# Patient Record
Sex: Female | Born: 1965
Health system: Southern US, Community
[De-identification: ages and names within clinical notes are randomized; demographics above are authoritative.]

## PROBLEM LIST (undated history)

## (undated) DIAGNOSIS — F419 Anxiety disorder, unspecified: Secondary | ICD-10-CM

## (undated) DIAGNOSIS — G4700 Insomnia, unspecified: Secondary | ICD-10-CM

## (undated) DIAGNOSIS — F32A Depression, unspecified: Secondary | ICD-10-CM

## (undated) DIAGNOSIS — J189 Pneumonia, unspecified organism: Secondary | ICD-10-CM

## (undated) DIAGNOSIS — F329 Major depressive disorder, single episode, unspecified: Secondary | ICD-10-CM

## (undated) DIAGNOSIS — M549 Dorsalgia, unspecified: Secondary | ICD-10-CM

## (undated) DIAGNOSIS — F4310 Post-traumatic stress disorder, unspecified: Secondary | ICD-10-CM

## (undated) DIAGNOSIS — T4145XA Adverse effect of unspecified anesthetic, initial encounter: Secondary | ICD-10-CM

## (undated) DIAGNOSIS — Z8669 Personal history of other diseases of the nervous system and sense organs: Secondary | ICD-10-CM

## (undated) DIAGNOSIS — G8929 Other chronic pain: Secondary | ICD-10-CM

## (undated) DIAGNOSIS — T8859XA Other complications of anesthesia, initial encounter: Secondary | ICD-10-CM

## (undated) DIAGNOSIS — Z8709 Personal history of other diseases of the respiratory system: Secondary | ICD-10-CM

## (undated) HISTORY — PX: OTHER SURGICAL HISTORY: SHX169

## (undated) HISTORY — PX: BACK SURGERY: SHX140

---

## 2003-02-02 DIAGNOSIS — Z8709 Personal history of other diseases of the respiratory system: Secondary | ICD-10-CM

## 2003-02-02 HISTORY — DX: Personal history of other diseases of the respiratory system: Z87.09

## 2014-06-23 ENCOUNTER — Emergency Department (HOSPITAL_COMMUNITY)
Admission: EM | Admit: 2014-06-23 | Discharge: 2014-06-23 | Disposition: A | Discharge status: Home / Self Care | Payer: Medicare Other | Attending: Emergency Medicine | Admitting: Emergency Medicine

## 2014-06-23 ENCOUNTER — Emergency Department (HOSPITAL_COMMUNITY): Payer: Medicare Other

## 2014-06-23 ENCOUNTER — Encounter (HOSPITAL_COMMUNITY): Payer: Self-pay | Admitting: Emergency Medicine

## 2014-06-23 DIAGNOSIS — F419 Anxiety disorder, unspecified: Secondary | ICD-10-CM | POA: Diagnosis not present

## 2014-06-23 DIAGNOSIS — Y998 Other external cause status: Secondary | ICD-10-CM | POA: Diagnosis not present

## 2014-06-23 DIAGNOSIS — Y9389 Activity, other specified: Secondary | ICD-10-CM | POA: Insufficient documentation

## 2014-06-23 DIAGNOSIS — Z79899 Other long term (current) drug therapy: Secondary | ICD-10-CM | POA: Diagnosis not present

## 2014-06-23 DIAGNOSIS — Y9289 Other specified places as the place of occurrence of the external cause: Secondary | ICD-10-CM | POA: Insufficient documentation

## 2014-06-23 DIAGNOSIS — S99922A Unspecified injury of left foot, initial encounter: Secondary | ICD-10-CM | POA: Diagnosis present

## 2014-06-23 DIAGNOSIS — Z87891 Personal history of nicotine dependence: Secondary | ICD-10-CM | POA: Insufficient documentation

## 2014-06-23 DIAGNOSIS — W208XXA Other cause of strike by thrown, projected or falling object, initial encounter: Secondary | ICD-10-CM | POA: Insufficient documentation

## 2014-06-23 DIAGNOSIS — S9032XA Contusion of left foot, initial encounter: Secondary | ICD-10-CM

## 2014-06-23 DIAGNOSIS — J398 Other specified diseases of upper respiratory tract: Secondary | ICD-10-CM | POA: Diagnosis not present

## 2014-06-23 HISTORY — DX: Anxiety disorder, unspecified: F41.9

## 2014-06-23 MED ORDER — HYDROCODONE-ACETAMINOPHEN 5-325 MG PO TABS: 1.0000 | ORAL_TABLET | ORAL | Status: DC | PRN

## 2014-06-23 MED ORDER — KETOROLAC TROMETHAMINE 60 MG/2ML IM SOLN
60.0000 mg | Freq: Once | INTRAMUSCULAR | Status: AC
  Administered 2014-06-23: 60 mg via INTRAMUSCULAR
  Filled 2014-06-23: qty 2

## 2014-06-23 NOTE — ED Provider Notes (Signed)
CSN: [id number]     Arrival date & time 06/23/14  1216 History  This chart was scribed for non-physician practitioner Kerrie Buffalo, NP, working with Vanetta Mulders, MD, by Tanda Rockers, ED Scribe. This patient was seen in room APA15/APA15 and the patient's care was started at 1:10 PM.    Chief Complaint  Patient presents with  . Foot Pain    The history is provided by the patient. No language interpreter was used.     HPI Comments: Kayla Howard is a 49 y.o. female who presents to the Emergency Department complaining of left foot pain x 1 month, gradually worsening. She describes it as a throbbing sensation. Pt dropped a 20 lb weight on her left foot on 05/15/2014. Pt was seen at [facility] for this and had DG L Foot with no acute findings. Pt mentions that the swelling has gradually been decreasing but she is unable to bear weight on foot causing her to be unable to walk. Pt has been icing foot and using spinal cord stimulator insertion without relief. She has been taking 800 mg Ibuprofen without relief. Denies weakness, numbness, tingling, color change, or any other symptoms.   Past Medical History  Diagnosis Date  . Anxiety    Past Surgical History  Procedure Laterality Date  . Back surgery    . Spinal cord stimulator insertion     Family History  Problem Relation Age of Onset  . Hypertension Mother   . Diabetes Father    History  Substance Use Topics  . Smoking status: Former Games developer  . Smokeless tobacco: Never Used  . Alcohol Use: No   OB History    Gravida Para Term Preterm AB TAB SAB Ectopic Multiple Living   Review of Systems  Constitutional: Negative for appetite change and fatigue.  HENT: Negative for congestion, ear discharge and sinus pressure.   Eyes: Negative for discharge.  Respiratory: Negative for cough.   Cardiovascular: Negative for chest pain.  Gastrointestinal: Negative for abdominal pain and diarrhea.  Genitourinary: Negative for  frequency and hematuria.  Musculoskeletal: Positive for joint swelling, arthralgias (Left foot pain.) and gait problem. Negative for back pain.  Skin: Negative for color change and rash.  Neurological: Negative for seizures, weakness, numbness and headaches.  Psychiatric/Behavioral: Negative for hallucinations.      Allergies  Ambien; Flexeril; and Morphine and related  Home Medications   Prior to Admission medications   Medication Sig Start Date End Date Taking? Authorizing Provider  diazepam (VALIUM) 10 MG tablet Take 1 tablet by mouth daily as needed for anxiety.  06/04/14  Yes Historical Provider, MD  ibuprofen (ADVIL,MOTRIN) 800 MG tablet Take 800 mg by mouth every 8 (eight) hours as needed for moderate pain.   Yes Historical Provider, MD  lamoTRIgine (LAMICTAL) 200 MG tablet Take 2 tablets by mouth at bedtime.  04/03/14  Yes Historical Provider, MD  LATUDA 40 MG TABS tablet Take 0.5 tablets by mouth at bedtime.  04/03/14  Yes Historical Provider, MD  prazosin (MINIPRESS) 1 MG capsule Take 1 capsule by mouth at bedtime.  04/03/14  Yes Historical Provider, MD  traZODone (DESYREL) 100 MG tablet Take 2 tablets by mouth at bedtime.  04/03/14  Yes Historical Provider, MD  HYDROcodone-acetaminophen (NORCO/VICODIN) 5-325 MG per tablet Take 1 tablet by mouth every 4 (four) hours as needed. 06/23/14   Hope Orlene Och, NP   Triage Vitals: BP 167/79 mmHg  Pulse 79  Temp(Src) 97.9 F (36.6 C) (Oral)  Resp 16  Ht 6\' 1"  (1.854 m)  Wt 158 lb (71.668 kg)  BMI 20.85 kg/m2  SpO2 100%  LMP 06/22/2014   Physical Exam  Constitutional: She is oriented to person, place, and time. She appears well-developed and well-nourished. No distress.  HENT:  Head: Normocephalic and atraumatic.  Eyes: Conjunctivae and EOM are normal.  Neck: Neck supple. Tracheal deviation present.  Cardiovascular: Normal rate.   Pulmonary/Chest: Effort normal.  Musculoskeletal: Normal range of motion.       Feet:  Swelling and  ecchymosis at left toes and extends to the ankle. Pedal pulses 2+ bilateral, adequate circulation, good touch sensation. Equal strength with plantar and dorsiflexion.    Neurological: She is alert and oriented to person, place, and time.  Skin: Skin is warm and dry.  Psychiatric: She has a normal mood and affect. Her behavior is normal.  Nursing note and vitals reviewed.   ED Course  Procedures (including critical care time) X-ray, Toradol 60 mg IM ASO, (patient has crutches), ice  DIAGNOSTIC STUDIES: Oxygen Saturation is 100% on RA, normal by my interpretation.    COORDINATION OF CARE: 1:15 PM-Discussed treatment plan which includes awaiting DG L Foot results with pt at bedside and pt agreed to plan.   Labs Review Labs Reviewed - No data to display  Imaging Review Dg Foot Complete Left  06/23/2014   CLINICAL DATA:  Left foot pain/injury  EXAM: LEFT FOOT - COMPLETE 3+ VIEW  COMPARISON:  None.  FINDINGS: No fracture or dislocation is seen.  The joint spaces are preserved.  Visualized soft tissues are within normal limits.  IMPRESSION: No fracture or dislocation is seen.   Electronically Signed   By: Charline BillsSriyesh  Krishnan M.D.   On: 06/23/2014 13:06    MDM  49 y.o. female with pain, swelling and ecchymosis to the left foot that extend to the ankle s/p injury. Placed in ASO, pain management, stable for discharge without neurovascular compromise. She will follow up with ortho as needed. Discussed with the patient clinical and x-ray findings and plan of care and all questioned fully answered.   Final diagnoses:  Contusion of left foot, initial encounter   I personally performed the services described in this documentation, which was scribed in my presence. The recorded information has been reviewed and is accurate.      Dix HillsHope M Neese, NP 06/23/14 1423  Vanetta MuldersScott Zackowski, MD 06/25/14 [phone]

## 2014-06-23 NOTE — ED Notes (Signed)
Pt dropped a 20 lb weight on her L foot April 13. [facility] and told there was no fx. Pt states pain is worse and she is unable to bear weight on the foot.

## 2014-06-23 NOTE — Discharge Instructions (Signed)
°  Take the medication as directed. Do not take if driving as it will make you sleepy. Follow up with Dr. Romeo AppleHarrison or return as needed.

## 2015-02-03 DIAGNOSIS — M5030 Other cervical disc degeneration, unspecified cervical region: Secondary | ICD-10-CM | POA: Diagnosis not present

## 2015-02-03 DIAGNOSIS — Z79899 Other long term (current) drug therapy: Secondary | ICD-10-CM | POA: Diagnosis not present

## 2015-02-03 DIAGNOSIS — Z981 Arthrodesis status: Secondary | ICD-10-CM | POA: Diagnosis not present

## 2015-02-03 DIAGNOSIS — M5000 Cervical disc disorder with myelopathy, unspecified cervical region: Secondary | ICD-10-CM | POA: Diagnosis not present

## 2015-02-03 DIAGNOSIS — M4692 Unspecified inflammatory spondylopathy, cervical region: Secondary | ICD-10-CM | POA: Diagnosis not present

## 2015-02-03 DIAGNOSIS — Z87891 Personal history of nicotine dependence: Secondary | ICD-10-CM | POA: Diagnosis not present

## 2015-02-03 DIAGNOSIS — M1990 Unspecified osteoarthritis, unspecified site: Secondary | ICD-10-CM | POA: Diagnosis not present

## 2015-02-14 DIAGNOSIS — D72829 Elevated white blood cell count, unspecified: Secondary | ICD-10-CM | POA: Diagnosis not present

## 2015-03-06 DIAGNOSIS — F411 Generalized anxiety disorder: Secondary | ICD-10-CM | POA: Diagnosis not present

## 2015-03-06 DIAGNOSIS — F320 Major depressive disorder, single episode, mild: Secondary | ICD-10-CM | POA: Diagnosis not present

## 2015-03-06 DIAGNOSIS — F4310 Post-traumatic stress disorder, unspecified: Secondary | ICD-10-CM | POA: Diagnosis not present

## 2015-03-20 DIAGNOSIS — R0781 Pleurodynia: Secondary | ICD-10-CM | POA: Diagnosis not present

## 2015-03-20 DIAGNOSIS — R079 Chest pain, unspecified: Secondary | ICD-10-CM | POA: Diagnosis not present

## 2015-03-20 DIAGNOSIS — Z79899 Other long term (current) drug therapy: Secondary | ICD-10-CM | POA: Diagnosis not present

## 2015-03-20 DIAGNOSIS — Z888 Allergy status to other drugs, medicaments and biological substances status: Secondary | ICD-10-CM | POA: Diagnosis not present

## 2015-05-29 DIAGNOSIS — F320 Major depressive disorder, single episode, mild: Secondary | ICD-10-CM | POA: Diagnosis not present

## 2015-05-29 DIAGNOSIS — F4310 Post-traumatic stress disorder, unspecified: Secondary | ICD-10-CM | POA: Diagnosis not present

## 2015-05-29 DIAGNOSIS — F411 Generalized anxiety disorder: Secondary | ICD-10-CM | POA: Diagnosis not present

## 2015-06-25 DIAGNOSIS — M961 Postlaminectomy syndrome, not elsewhere classified: Secondary | ICD-10-CM | POA: Diagnosis not present

## 2015-06-25 DIAGNOSIS — M5412 Radiculopathy, cervical region: Secondary | ICD-10-CM | POA: Diagnosis not present

## 2015-06-25 DIAGNOSIS — M542 Cervicalgia: Secondary | ICD-10-CM | POA: Diagnosis not present

## 2015-07-07 DIAGNOSIS — M961 Postlaminectomy syndrome, not elsewhere classified: Secondary | ICD-10-CM | POA: Diagnosis not present

## 2015-07-07 DIAGNOSIS — M5412 Radiculopathy, cervical region: Secondary | ICD-10-CM | POA: Diagnosis not present

## 2015-08-08 DIAGNOSIS — M5412 Radiculopathy, cervical region: Secondary | ICD-10-CM | POA: Diagnosis not present

## 2015-08-08 DIAGNOSIS — M542 Cervicalgia: Secondary | ICD-10-CM | POA: Diagnosis not present

## 2015-08-08 DIAGNOSIS — M961 Postlaminectomy syndrome, not elsewhere classified: Secondary | ICD-10-CM | POA: Diagnosis not present

## 2015-08-28 DIAGNOSIS — F321 Major depressive disorder, single episode, moderate: Secondary | ICD-10-CM | POA: Diagnosis not present

## 2015-08-28 DIAGNOSIS — F4310 Post-traumatic stress disorder, unspecified: Secondary | ICD-10-CM | POA: Diagnosis not present

## 2015-08-28 DIAGNOSIS — F411 Generalized anxiety disorder: Secondary | ICD-10-CM | POA: Diagnosis not present

## 2015-09-02 DIAGNOSIS — M542 Cervicalgia: Secondary | ICD-10-CM | POA: Diagnosis not present

## 2015-09-02 DIAGNOSIS — M5412 Radiculopathy, cervical region: Secondary | ICD-10-CM | POA: Diagnosis not present

## 2015-09-22 DIAGNOSIS — F4310 Post-traumatic stress disorder, unspecified: Secondary | ICD-10-CM | POA: Diagnosis not present

## 2015-09-22 DIAGNOSIS — Z1389 Encounter for screening for other disorder: Secondary | ICD-10-CM | POA: Diagnosis not present

## 2015-09-22 DIAGNOSIS — F419 Anxiety disorder, unspecified: Secondary | ICD-10-CM | POA: Diagnosis not present

## 2015-09-22 DIAGNOSIS — Z789 Other specified health status: Secondary | ICD-10-CM | POA: Diagnosis not present

## 2015-10-02 DIAGNOSIS — R51 Headache: Secondary | ICD-10-CM | POA: Diagnosis not present

## 2015-10-02 DIAGNOSIS — M961 Postlaminectomy syndrome, not elsewhere classified: Secondary | ICD-10-CM | POA: Diagnosis not present

## 2015-10-02 DIAGNOSIS — M542 Cervicalgia: Secondary | ICD-10-CM | POA: Diagnosis not present

## 2015-10-08 DIAGNOSIS — Z020 Encounter for examination for admission to educational institution: Secondary | ICD-10-CM | POA: Diagnosis not present

## 2015-10-22 DIAGNOSIS — Z713 Dietary counseling and surveillance: Secondary | ICD-10-CM | POA: Diagnosis not present

## 2015-10-22 DIAGNOSIS — Z6821 Body mass index (BMI) 21.0-21.9, adult: Secondary | ICD-10-CM | POA: Diagnosis not present

## 2015-10-22 DIAGNOSIS — Z020 Encounter for examination for admission to educational institution: Secondary | ICD-10-CM | POA: Diagnosis not present

## 2015-10-22 DIAGNOSIS — Z299 Encounter for prophylactic measures, unspecified: Secondary | ICD-10-CM | POA: Diagnosis not present

## 2015-10-22 DIAGNOSIS — F39 Unspecified mood [affective] disorder: Secondary | ICD-10-CM | POA: Diagnosis not present

## 2015-11-20 DIAGNOSIS — F411 Generalized anxiety disorder: Secondary | ICD-10-CM | POA: Diagnosis not present

## 2015-11-20 DIAGNOSIS — F321 Major depressive disorder, single episode, moderate: Secondary | ICD-10-CM | POA: Diagnosis not present

## 2015-11-20 DIAGNOSIS — F4310 Post-traumatic stress disorder, unspecified: Secondary | ICD-10-CM | POA: Diagnosis not present

## 2015-11-28 DIAGNOSIS — M545 Low back pain: Secondary | ICD-10-CM | POA: Diagnosis not present

## 2015-11-28 DIAGNOSIS — M5412 Radiculopathy, cervical region: Secondary | ICD-10-CM | POA: Diagnosis not present

## 2015-11-28 DIAGNOSIS — R51 Headache: Secondary | ICD-10-CM | POA: Diagnosis not present

## 2015-11-28 DIAGNOSIS — M961 Postlaminectomy syndrome, not elsewhere classified: Secondary | ICD-10-CM | POA: Diagnosis not present

## 2015-11-28 DIAGNOSIS — M542 Cervicalgia: Secondary | ICD-10-CM | POA: Diagnosis not present

## 2015-12-05 DIAGNOSIS — Z23 Encounter for immunization: Secondary | ICD-10-CM | POA: Diagnosis not present

## 2016-01-03 DIAGNOSIS — G8929 Other chronic pain: Secondary | ICD-10-CM | POA: Diagnosis not present

## 2016-01-03 DIAGNOSIS — M545 Low back pain: Secondary | ICD-10-CM | POA: Diagnosis not present

## 2016-01-03 DIAGNOSIS — G8928 Other chronic postprocedural pain: Secondary | ICD-10-CM | POA: Diagnosis not present

## 2016-01-03 DIAGNOSIS — M5032 Other cervical disc degeneration, mid-cervical region, unspecified level: Secondary | ICD-10-CM | POA: Diagnosis not present

## 2016-01-03 DIAGNOSIS — Z79899 Other long term (current) drug therapy: Secondary | ICD-10-CM | POA: Diagnosis not present

## 2016-01-03 DIAGNOSIS — M47892 Other spondylosis, cervical region: Secondary | ICD-10-CM | POA: Diagnosis not present

## 2016-01-03 DIAGNOSIS — M5030 Other cervical disc degeneration, unspecified cervical region: Secondary | ICD-10-CM | POA: Diagnosis not present

## 2016-01-05 DIAGNOSIS — M542 Cervicalgia: Secondary | ICD-10-CM | POA: Diagnosis not present

## 2016-01-05 DIAGNOSIS — M545 Low back pain: Secondary | ICD-10-CM | POA: Diagnosis not present

## 2016-01-05 DIAGNOSIS — M5412 Radiculopathy, cervical region: Secondary | ICD-10-CM | POA: Diagnosis not present

## 2016-01-05 DIAGNOSIS — R51 Headache: Secondary | ICD-10-CM | POA: Diagnosis not present

## 2016-03-11 DIAGNOSIS — M5412 Radiculopathy, cervical region: Secondary | ICD-10-CM | POA: Diagnosis not present

## 2016-03-11 DIAGNOSIS — M5416 Radiculopathy, lumbar region: Secondary | ICD-10-CM | POA: Diagnosis not present

## 2016-03-15 DIAGNOSIS — F411 Generalized anxiety disorder: Secondary | ICD-10-CM | POA: Diagnosis not present

## 2016-03-15 DIAGNOSIS — F321 Major depressive disorder, single episode, moderate: Secondary | ICD-10-CM | POA: Diagnosis not present

## 2016-03-15 DIAGNOSIS — F4310 Post-traumatic stress disorder, unspecified: Secondary | ICD-10-CM | POA: Diagnosis not present

## 2016-04-09 DIAGNOSIS — R030 Elevated blood-pressure reading, without diagnosis of hypertension: Secondary | ICD-10-CM | POA: Diagnosis not present

## 2016-04-09 DIAGNOSIS — Z9689 Presence of other specified functional implants: Secondary | ICD-10-CM | POA: Diagnosis not present

## 2016-04-09 DIAGNOSIS — M961 Postlaminectomy syndrome, not elsewhere classified: Secondary | ICD-10-CM | POA: Diagnosis not present

## 2016-04-09 DIAGNOSIS — M5416 Radiculopathy, lumbar region: Secondary | ICD-10-CM | POA: Diagnosis not present

## 2016-05-11 DIAGNOSIS — M961 Postlaminectomy syndrome, not elsewhere classified: Secondary | ICD-10-CM | POA: Diagnosis not present

## 2016-05-11 DIAGNOSIS — M5416 Radiculopathy, lumbar region: Secondary | ICD-10-CM | POA: Diagnosis not present

## 2016-05-24 DIAGNOSIS — M5416 Radiculopathy, lumbar region: Secondary | ICD-10-CM | POA: Diagnosis not present

## 2016-05-24 DIAGNOSIS — T85192D Other mechanical complication of implanted electronic neurostimulator (electrode) of spinal cord, subsequent encounter: Secondary | ICD-10-CM | POA: Diagnosis not present

## 2016-05-24 DIAGNOSIS — R030 Elevated blood-pressure reading, without diagnosis of hypertension: Secondary | ICD-10-CM | POA: Diagnosis not present

## 2016-05-24 DIAGNOSIS — M961 Postlaminectomy syndrome, not elsewhere classified: Secondary | ICD-10-CM | POA: Diagnosis not present

## 2016-05-24 DIAGNOSIS — M545 Low back pain: Secondary | ICD-10-CM | POA: Diagnosis not present

## 2016-05-25 ENCOUNTER — Other Ambulatory Visit: Payer: Self-pay | Admitting: Neurosurgery

## 2016-06-07 DIAGNOSIS — F321 Major depressive disorder, single episode, moderate: Secondary | ICD-10-CM | POA: Diagnosis not present

## 2016-06-07 DIAGNOSIS — F411 Generalized anxiety disorder: Secondary | ICD-10-CM | POA: Diagnosis not present

## 2016-06-07 DIAGNOSIS — F4310 Post-traumatic stress disorder, unspecified: Secondary | ICD-10-CM | POA: Diagnosis not present

## 2016-06-09 ENCOUNTER — Other Ambulatory Visit (HOSPITAL_COMMUNITY): Payer: Medicare Other

## 2016-06-10 NOTE — Pre-Procedure Instructions (Signed)
Kayla Howard  06/10/2016      [facility] [place]  - [place] - [address] [place] [id number] Phone: [phone] Fax: [phone]    Your procedure is scheduled on Fri, May 25 @ 7:30 AM  [facility] Admitting at 5:30 AM  Call this number if you have problems the morning of surgery:  [phone]   Remember:  Do not eat food or drink liquids after midnight.               Stop taking your Ibuprofen. No Goody's,BC's,Aleve,Advil,Motrin,Aspirin,Fish Oil, or any Herbal Medications.    Do not wear jewelry, make-up or nail polish.  Do not wear lotions, powders,perfumes, or deoderant.  Do not shave 48 hours prior to surgery.   Do not bring valuables to the hospital.  [facility] is not responsible for any belongings or valuables.  Contacts, dentures or bridgework may not be worn into surgery.  Leave your suitcase in the car.  After surgery it may be brought to your room.  For patients admitted to the hospital, discharge time will be determined by your treatment team.  Patients discharged the day of surgery will not be allowed to drive home.    [facility] Pre[facility]  Before surgery, you can play an important role.  Because skin is not sterile, your skin needs to be as free of germs as possible.  You can reduce the number of germs on you skin by washing with CHG (chlorahexidine gluconate) soap before surgery.  CHG is an antiseptic cleaner which kills germs and bonds with the skin to continue killing germs even after washing.  Please DO NOT use if you have an allergy to CHG or antibacterial soaps.  If your skin becomes reddened/irritated stop using the CHG and inform your nurse when you arrive at Short Stay.  Do not shave (including legs and underarms) for at least 48 hours prior to the first CHG shower.  You may shave your face.  Please follow these instructions carefully:   1.  Shower with  CHG Soap the night before surgery and the                                morning of Surgery.  2.  If you choose to wash your hair, wash your hair first as usual with your       normal shampoo.  3.  After you shampoo, rinse your hair and body thoroughly to remove the                      Shampoo.  4.  Use CHG as you would any other liquid soap.  You can apply chg directly       to the skin and wash gently with scrungie or a clean washcloth.  5.  Apply the CHG Soap to your body ONLY FROM THE NECK DOWN.        Do not use on open wounds or open sores.  Avoid contact with your eyes,       ears, mouth and genitals (private parts).  Wash genitals (private parts)       with your normal soap.  6.  Wash thoroughly, paying special attention to the area where your surgery        will be performed.  7.  Thoroughly rinse your body with warm water from the neck down.  8.  DO NOT shower/wash with your normal soap after using and rinsing off       the CHG Soap.  9.  Pat yourself dry with a clean towel.            10.  Wear clean pajamas.            11.  Place clean sheets on your bed the night of your first shower and do not        sleep with pets.  Day of Surgery  Do not apply any lotions/deoderants the morning of surgery.  Please wear clean clothes to the hospital/surgery center.    Please read over the following fact sheets that you were given. Pain Booklet, Coughing and Deep Breathing, MRSA Information and Surgical Site Infection Prevention

## 2016-06-11 ENCOUNTER — Encounter (HOSPITAL_COMMUNITY): Payer: Self-pay

## 2016-06-11 ENCOUNTER — Encounter (HOSPITAL_COMMUNITY)
Admission: RE | Admit: 2016-06-11 | Discharge: 2016-06-11 | Disposition: A | Discharge status: Home / Self Care | Payer: Medicare Other | Source: Ambulatory Visit | Attending: Neurosurgery | Admitting: Neurosurgery

## 2016-06-11 DIAGNOSIS — M961 Postlaminectomy syndrome, not elsewhere classified: Secondary | ICD-10-CM | POA: Insufficient documentation

## 2016-06-11 DIAGNOSIS — M5416 Radiculopathy, lumbar region: Secondary | ICD-10-CM | POA: Insufficient documentation

## 2016-06-11 DIAGNOSIS — Z01812 Encounter for preprocedural laboratory examination: Secondary | ICD-10-CM | POA: Diagnosis not present

## 2016-06-11 HISTORY — DX: Dorsalgia, unspecified: M54.9

## 2016-06-11 HISTORY — DX: Personal history of other diseases of the nervous system and sense organs: Z86.69

## 2016-06-11 HISTORY — DX: Major depressive disorder, single episode, unspecified: F32.9

## 2016-06-11 HISTORY — DX: Insomnia, unspecified: G47.00

## 2016-06-11 HISTORY — DX: Depression, unspecified: F32.A

## 2016-06-11 HISTORY — DX: Pneumonia, unspecified organism: J18.9

## 2016-06-11 HISTORY — DX: Adverse effect of unspecified anesthetic, initial encounter: T41.45XA

## 2016-06-11 HISTORY — DX: Post-traumatic stress disorder, unspecified: F43.10

## 2016-06-11 HISTORY — DX: Personal history of other diseases of the respiratory system: Z87.09

## 2016-06-11 HISTORY — DX: Other complications of anesthesia, initial encounter: T88.59XA

## 2016-06-11 HISTORY — DX: Other chronic pain: G89.29

## 2016-06-11 LAB — HCG, SERUM, QUALITATIVE: Preg, Serum: NEGATIVE

## 2016-06-11 LAB — BASIC METABOLIC PANEL
ANION GAP: 7 (ref 5–15)
BUN: 5 mg/dL — ABNORMAL LOW (ref 6–20)
CO2: 27 mmol/L (ref 22–32)
Calcium: 9.1 mg/dL (ref 8.9–10.3)
Chloride: 104 mmol/L (ref 101–111)
Creatinine, Ser: 0.7 mg/dL (ref 0.44–1.00)
GFR calc Af Amer: >60 mL/min (ref >60)
GFR calc non Af Amer: >60 mL/min (ref >60)
GLUCOSE: 99 mg/dL (ref 65–99)
POTASSIUM: 3.8 mmol/L (ref 3.5–5.1)
Sodium: 138 mmol/L (ref 135–145)

## 2016-06-11 LAB — CBC
HEMATOCRIT: 35.2 % — AB (ref 36.0–46.0)
HEMOGLOBIN: 11.7 g/dL — AB (ref 12.0–15.0)
MCH: 29.2 pg (ref 26.0–34.0)
MCHC: 33.2 g/dL (ref 30.0–36.0)
MCV: 87.8 fL (ref 78.0–100.0)
Platelets: 268 10*3/uL (ref 150–400)
RBC: 4.01 MIL/uL (ref 3.87–5.11)
RDW: 14.4 % (ref 11.5–15.5)
WBC: 5.9 10*3/uL (ref 4.0–10.5)

## 2016-06-11 LAB — SURGICAL PCR SCREEN
MRSA, PCR: NEGATIVE
Staphylococcus aureus: NEGATIVE

## 2016-06-11 MED ORDER — CHLORHEXIDINE GLUCONATE CLOTH 2 % EX PADS: 6.0000 | MEDICATED_PAD | Freq: Once | CUTANEOUS | Status: DC

## 2016-06-11 NOTE — Progress Notes (Addendum)
Cardiologist denies  Medical Md with [facility]-sees PA very rarely  Echo denies  Stress test denies  Heart cath denies  EKG denies in past yr  CXR denies in past yr

## 2016-06-24 NOTE — H&P (Signed)
Patient ID:   [id number] Patient: Kayla Howard  Date of Birth: 07/31/1965 Visit Type: Office Visit   Date: 05/24/2016 03:15 PM Provider: Danae Orleans. Venetia Maxon MD   This 51 year old female presents for back pain.  History of Present Illness: 1.  back pain    Kayla Howard, 51 year old female, presents with a malfunctioning spinal cord stimulator and lead.  The patient mentions severe anxiety post-op after a prior lumbar fusion so she mentioned that she may want to be sedated for this procedure.      Medical/Surgical/Interim History Reviewed, no change.   Family History: Reviewed, no changes.    Social History: Tobacco use reviewed. Reviewed, no changes.     MEDICATIONS(added, continued or stopped this visit): Started Medication Directions Instruction Stopped  11/28/2015 baclofen 10 mg tablet take 1 tablet by oral route 3 times every day     ibuprofen 800 mg tablet      Lamictal 200 mg tablet      Latuda 20 mg tablet      trazodone 300 mg tablet      Valium 10 mg tablet        ALLERGIES: Ingredient Reaction Medication Name Comment  ZOLPIDEM TARTRATE  Ambien   CYCLOBENZAPRINE HCL  Flexeril    Reviewed, no changes.    Vitals Date Temp F BP Pulse Ht In Wt Lb BMI BSA Pain Score  05/24/2016  165/89 91 73 157.2 20.74  6/10      IMPRESSION conferred with Dr. Ollen Bowl and believe the whole spinal cord stimulator should be replaced.    Completed Orders (this encounter) Order Details Reason Side Interpretation Result Initial Treatment Date Region  Hypertension education Continue to monitor blood pressure. If blood pressure remains elevated contact primary care provider        Thoraco-lumbar Spine- AP/Lat      05/24/2016    Assessment/Plan # Detail Type Description   1. Assessment Elevated blood-pressure reading, w/o diagnosis of htn (R03.0).       2. Assessment Low back pain (M54.5).         Pain Assessment/Treatment Pain Scale: 6/10. Method: Numeric  Pain Intensity Scale. Location: back and right leg. Onset: 02/01/1998. Duration: varies. Quality: discomforting. Pain Assessment/Treatment follow-up plan of care: Patient is taking OTC pain relievers for relief..  Fall Risk Plan The patient has not fallen in the last year.  scheduled [employer] SCS with paddle revision for 06/25/2016.   Orders: Diagnostic Procedures: Assessment Procedure  M54.5 Thoraco-lumbar Spine- AP/Lat  Instruction(s)/Education: Assessment Instruction  R03.0 Hypertension education             Provider:  Danae Orleans. Venetia Maxon MD  05/24/2016 06:08 PM Dictation edited by: Philis Kendall    CC Providers: Loel Ro [address] [place], [place] [address]              Electronically signed by Danae Orleans. Venetia Maxon MD on 05/25/2016 04:33 PM  Patient ID:   [id number] Patient: Kayla Howard  Date of Birth: Sep 23, 1965 Visit Type: Office Visit   Date: 04/09/2016 10:30 AM Provider: Joneen Roach  Historian: self  This 51 year old female presents for back pain.  History of Present Illness: 1.  back pain    Ms. Kukla presents to the clinic today after last being seen on March 11, 2016 for a bilateral L4-5 transforaminal epidural steroid injection. The patient had received an L3-4 epidural steroid injection in December which had worked extremely well to cover her  pain. She was then supposed to return for a C6-7 epidural steroid injection but ultimately was complaining of more back pain at that time.  Dr. Ollen Bowl tried adjusting injection to see if he can provide better relief.  The patient also has a spinal cord stimulator from [employer] in place.  She had a battery replaced in 2015 but does not feel that it is functioning correctly.  I have contacted [employer] and asked them to set up a time to meet with the patient. The patient is not utilize any narcotic medications as she smokes marijuana and will soon be utilizing CBD oil.  She previously  utilize this in [place] and found to be much more effective for pain then narcotics.  Today she returns stating unfortunately the most recent injection provided her only about 2 weeks of improvement in her symptoms.  She does not feel that it was as effective as the injection in December.  She feels that she has had a quick a recurrence of her pain and more severe pain. She continues to have pain radiating into the right hip which also has some arthritis in it.  The pain radiates to the right groin and down the right leg.  It is likely combination of the right hip and low back creating the symptoms.  She reports no recent falls.  She has pain with all activity and even when seated.  She does sit off to the left to try to reduce the pressure on the right side.  She goes to the ER to receive Dilaudid injections.  She is requesting a Toradol injection today.  She states oral medications do not seem to be beneficial.  She rates her pain an 8/10.  At this point she states that her neck is fairly stable.      Medical/Surgical/Interim History Reviewed, no change.   PAST MEDICAL HISTORY, SURGICAL HISTORY, FAMILY HISTORY, SOCIAL HISTORY AND REVIEW OF SYSTEMS I have reviewed the patient's past medical, surgical, family and social history as well as the comprehensive review of systems as included on the [facility] history form dated 06/25/2015, which I have signed.  Family History: Reviewed, no changes.    Social History: Tobacco use reviewed. Reviewed, no changes.     MEDICATIONS(added, continued or stopped this visit): Started Medication Directions Instruction Stopped  11/28/2015 baclofen 10 mg tablet take 1 tablet by oral route 3 times every day     ibuprofen 800 mg tablet      Lamictal 200 mg tablet      Latuda 20 mg tablet      trazodone 300 mg tablet      Valium 10 mg tablet        ALLERGIES: Ingredient Reaction Medication Name Comment  ZOLPIDEM TARTRATE   Ambien   CYCLOBENZAPRINE HCL  Flexeril      REVIEW OF SYSTEMS System Neg/Pos Details  Constitutional Negative Chills, fatigue, fever, malaise, night sweats, weight gain and weight loss.  ENMT Negative Ear drainage, hearing loss, nasal drainage, otalgia, sinus pressure and sore throat.  Eyes Negative Eye discharge, eye pain and vision changes.  Respiratory Negative Chronic cough, cough, dyspnea, known TB exposure and wheezing.  Cardio Negative Chest pain, claudication, edema and irregular heartbeat/palpitations.  GI Negative Abdominal pain, blood in stool, change in stool pattern, constipation, decreased appetite, diarrhea, heartburn, nausea and vomiting.  GU Negative Dysuria, hematuria, hot flashes, irregular menses, polyuria, urinary frequency, urinary incontinence and urinary retention.  Endocrine Negative Cold intolerance, heat  intolerance, polydipsia and polyphagia.  Neuro Positive Extremity weakness.  Neuro Negative Dizziness, gait disturbance, headache, memory impairment, numbness in extremity, seizures and tremors.  Psych Positive Anxiety, Depression.  Psych Negative Insomnia.  Integumentary Negative Brittle hair, brittle nails, change in shape/size of mole(s), hair loss, hirsutism, hives, pruritus, rash and skin lesion.  MS Positive Back pain, Joint pain, Neck pain, Muscle cramps.  MS Negative Joint swelling and muscle weakness.  Hema/Lymph Negative Easy bleeding, easy bruising and lymphadenopathy.  Allergic/Immuno Negative Contact allergy, environmental allergies, food allergies and seasonal allergies.  Reproductive Negative Breast discharge, breast lump(s), dysmenorrhea, dyspareunia, history of abnormal PAP smear and vaginal discharge.     Vitals Date Temp F BP Pulse Ht In Wt Lb BMI BSA Pain Score  04/09/2016  160/85 106 73 150.4 19.84  8/10     PHYSICAL EXAM General General  Appearance: normal Mood/Affect: normal Orientation: normal Pulses/Edema: normal Gait/Station: normal, normal toe and heel walk Coordination: normal Respiratory: non-labored during exam Pupils: equal, anicteric Level of Distress: no acute distress Overall Appearance: Normal  Cardiovascular Cardiac: normal  Respiratory Lungs: normal  Neurological Orientation: normal Recent and Remote Memory: normal Attention Span and Concentration:   normal Language: normal Fund of Knowledge: normal  Stability Cervical Spine: stable Lumbar Spine: motion is with pain Right Lower Extremity: abnormal hip  Range of Motion Lumbar Spine: all mild, decreased range of motion   Cranial Nerves II. Optic Nerve/Visual Fields: normal III. Oculomotor: normal IV. Trochlear: normal V. Trigeminal: normal VI. Abducens: normal VII. Facial: normal VIII. Acoustic/Vestibular: normal IX. Glossopharyngeal: normal X. Vagus: normal XI. Spinal Accessory: normal XII. Hypoglossal: normal      IMPRESSION Chronic low back pain with right-sided radiculopathy  Completed Orders (this encounter) Order Details Reason Side Interpretation Result Initial Treatment Date Region  Hypertension education Continue to monitor blood pressure. If blood pressure continues to be elevated contact Primary Care Provider.         Assessment/Plan # Detail Type Description   1. Assessment Lumbar post-laminectomy syndrome (M96.1).   Plan Orders Lumbar Spine - L3-L4 ESI on 05/11/2016.       2. Assessment Lumbar radiculopathy (M54.16).       3. Assessment Spinal cord stimulator status (Z96.89).       4. Assessment Elevated blood-pressure reading, w/o diagnosis of htn (R03.0).         Pain Assessment/Treatment Pain Scale: 8/10. Method: Numeric Pain Intensity Scale. Location: back. Onset: 02/01/1998. Duration: varies. Quality: discomforting. Pain Assessment/Treatment follow-up plan of care: Patient taking medications  as prescribed..  Fall Risk Plan The patient has not fallen in the last year.  Scheduled the patient to repeat the injection she had in December to see if we can once again give her some good benefit.  The patient did state she is going to begin utilizing CBD oil.  She knows we cannot prescribe pain medications while she is utilizing marijuana.  Ultimately she finds this to be the most effective for her pain.  [employer] will contact her to do some reprogramming.  The patient states she feels stimulation in the left leg but not in the right leg where she needs it.  Rosey Batheresa, CMA, provided her with a Toradol injection (60 mg) in the office today.  The patient requested it be done in the gluteal muscle.  Orders: Office Procedures/Services: Assessment Service Comments  M96.1 Lumbar Spine - L3-L4 ESI 1 Month   Instruction(s)/Education: Assessment Instruction  R03.0 Hypertension education  Provider:  Joneen Roach  05/06/2016 08:05 AM  Under the supervision of Gwynne Edinger PhD MD Dictation edited by: Joneen Roach [facility]    CC Providers: Loel Ro [address] [place], [place] [address]              Electronically signed by Joneen Roach on 05/06/2016 08:05 AM

## 2016-06-25 ENCOUNTER — Encounter (HOSPITAL_COMMUNITY): Payer: Self-pay | Admitting: Anesthesiology

## 2016-06-25 ENCOUNTER — Inpatient Hospital Stay (HOSPITAL_COMMUNITY)
Admission: RE | Admit: 2016-06-25 | Discharge: 2016-06-26 | DRG: 029 | Disposition: A | Discharge status: Home / Self Care | Payer: Medicare Other | Source: Ambulatory Visit | Attending: Neurosurgery | Admitting: Neurosurgery

## 2016-06-25 ENCOUNTER — Encounter (HOSPITAL_COMMUNITY)
Admission: RE | Disposition: A | Discharge status: Home / Self Care | Payer: Self-pay | Source: Ambulatory Visit | Attending: Neurosurgery

## 2016-06-25 ENCOUNTER — Ambulatory Visit (HOSPITAL_COMMUNITY): Payer: Medicare Other

## 2016-06-25 ENCOUNTER — Ambulatory Visit (HOSPITAL_COMMUNITY): Payer: Medicare Other | Admitting: Anesthesiology

## 2016-06-25 DIAGNOSIS — G8929 Other chronic pain: Secondary | ICD-10-CM | POA: Diagnosis present

## 2016-06-25 DIAGNOSIS — T85113A Breakdown (mechanical) of implanted electronic neurostimulator, generator, initial encounter: Principal | ICD-10-CM | POA: Diagnosis present

## 2016-06-25 DIAGNOSIS — Y752 Prosthetic and other implants, materials and neurological devices associated with adverse incidents: Secondary | ICD-10-CM | POA: Diagnosis not present

## 2016-06-25 DIAGNOSIS — Z888 Allergy status to other drugs, medicaments and biological substances status: Secondary | ICD-10-CM | POA: Diagnosis not present

## 2016-06-25 DIAGNOSIS — M5416 Radiculopathy, lumbar region: Secondary | ICD-10-CM | POA: Diagnosis not present

## 2016-06-25 DIAGNOSIS — Z419 Encounter for procedure for purposes other than remedying health state, unspecified: Secondary | ICD-10-CM

## 2016-06-25 DIAGNOSIS — M961 Postlaminectomy syndrome, not elsewhere classified: Secondary | ICD-10-CM | POA: Diagnosis present

## 2016-06-25 DIAGNOSIS — F419 Anxiety disorder, unspecified: Secondary | ICD-10-CM | POA: Diagnosis not present

## 2016-06-25 DIAGNOSIS — F1290 Cannabis use, unspecified, uncomplicated: Secondary | ICD-10-CM | POA: Diagnosis not present

## 2016-06-25 DIAGNOSIS — F418 Other specified anxiety disorders: Secondary | ICD-10-CM | POA: Diagnosis present

## 2016-06-25 DIAGNOSIS — Z981 Arthrodesis status: Secondary | ICD-10-CM

## 2016-06-25 DIAGNOSIS — Z87891 Personal history of nicotine dependence: Secondary | ICD-10-CM

## 2016-06-25 DIAGNOSIS — T85193A Other mechanical complication of implanted electronic neurostimulator, generator, initial encounter: Secondary | ICD-10-CM | POA: Diagnosis not present

## 2016-06-25 DIAGNOSIS — T85192A Other mechanical complication of implanted electronic neurostimulator (electrode) of spinal cord, initial encounter: Secondary | ICD-10-CM | POA: Diagnosis not present

## 2016-06-25 DIAGNOSIS — M5489 Other dorsalgia: Secondary | ICD-10-CM | POA: Diagnosis present

## 2016-06-25 HISTORY — PX: SPINAL CORD STIMULATOR INSERTION: SHX5378

## 2016-06-25 HISTORY — PX: PAIN PUMP REMOVAL: SHX6391

## 2016-06-25 SURGERY — INSERTION, SPINAL CORD STIMULATOR, LUMBAR
Anesthesia: General

## 2016-06-25 MED ORDER — EPHEDRINE 5 MG/ML INJ
INTRAVENOUS | Status: AC
  Filled 2016-06-25: qty 10

## 2016-06-25 MED ORDER — ACETAMINOPHEN 325 MG PO TABS
650.0000 mg | ORAL_TABLET | ORAL | Status: DC | PRN
  Administered 2016-06-25: 650 mg via ORAL
  Filled 2016-06-25: qty 2

## 2016-06-25 MED ORDER — FENTANYL CITRATE (PF) 100 MCG/2ML IJ SOLN
INTRAMUSCULAR | Status: DC | PRN
  Administered 2016-06-25: 50 ug via INTRAVENOUS
  Administered 2016-06-25: 150 ug via INTRAVENOUS

## 2016-06-25 MED ORDER — ARTIFICIAL TEARS OPHTHALMIC OINT
TOPICAL_OINTMENT | OPHTHALMIC | Status: AC
  Filled 2016-06-25: qty 3.5

## 2016-06-25 MED ORDER — MEPERIDINE HCL 25 MG/ML IJ SOLN: 6.2500 mg | INTRAMUSCULAR | Status: DC | PRN

## 2016-06-25 MED ORDER — PROMETHAZINE HCL 25 MG/ML IJ SOLN: 6.2500 mg | INTRAMUSCULAR | Status: DC | PRN

## 2016-06-25 MED ORDER — BUPIVACAINE HCL (PF) 0.5 % IJ SOLN
INTRAMUSCULAR | Status: DC | PRN
  Administered 2016-06-25: 14 mL

## 2016-06-25 MED ORDER — ROCURONIUM BROMIDE 10 MG/ML (PF) SYRINGE
PREFILLED_SYRINGE | INTRAVENOUS | Status: AC
  Filled 2016-06-25: qty 5

## 2016-06-25 MED ORDER — ONDANSETRON HCL 4 MG/2ML IJ SOLN: 4.0000 mg | Freq: Four times a day (QID) | INTRAMUSCULAR | Status: DC | PRN

## 2016-06-25 MED ORDER — POLYETHYLENE GLYCOL 3350 17 G PO PACK: 17.0000 g | PACK | Freq: Every day | ORAL | Status: DC | PRN

## 2016-06-25 MED ORDER — METHOCARBAMOL 500 MG PO TABS
500.0000 mg | ORAL_TABLET | Freq: Four times a day (QID) | ORAL | Status: DC | PRN
  Administered 2016-06-25 – 2016-06-26 (×3): 500 mg via ORAL
  Filled 2016-06-25 (×2): qty 1

## 2016-06-25 MED ORDER — THROMBIN 5000 UNITS EX SOLR
CUTANEOUS | Status: AC
  Filled 2016-06-25: qty 10000

## 2016-06-25 MED ORDER — SUGAMMADEX SODIUM 200 MG/2ML IV SOLN
INTRAVENOUS | Status: DC | PRN
  Administered 2016-06-25: 200 mg via INTRAVENOUS

## 2016-06-25 MED ORDER — PHENOL 1.4 % MT LIQD: 1.0000 | OROMUCOSAL | Status: DC | PRN

## 2016-06-25 MED ORDER — FENTANYL CITRATE (PF) 250 MCG/5ML IJ SOLN
INTRAMUSCULAR | Status: AC
  Filled 2016-06-25: qty 5

## 2016-06-25 MED ORDER — PHENYLEPHRINE 40 MCG/ML (10ML) SYRINGE FOR IV PUSH (FOR BLOOD PRESSURE SUPPORT)
PREFILLED_SYRINGE | INTRAVENOUS | Status: AC
  Filled 2016-06-25: qty 10

## 2016-06-25 MED ORDER — LIDOCAINE 2% (20 MG/ML) 5 ML SYRINGE
INTRAMUSCULAR | Status: AC
  Filled 2016-06-25: qty 5

## 2016-06-25 MED ORDER — ROCURONIUM BROMIDE 100 MG/10ML IV SOLN
INTRAVENOUS | Status: DC | PRN
  Administered 2016-06-25 (×2): 20 mg via INTRAVENOUS
  Administered 2016-06-25: 40 mg via INTRAVENOUS

## 2016-06-25 MED ORDER — HYDROCODONE-ACETAMINOPHEN 7.5-325 MG PO TABS: 1.0000 | ORAL_TABLET | Freq: Once | ORAL | Status: DC | PRN

## 2016-06-25 MED ORDER — TRAZODONE HCL 100 MG PO TABS
200.0000 mg | ORAL_TABLET | Freq: Every day | ORAL | Status: DC
  Administered 2016-06-25: 200 mg via ORAL
  Filled 2016-06-25: qty 2

## 2016-06-25 MED ORDER — CEFAZOLIN SODIUM-DEXTROSE 2-4 GM/100ML-% IV SOLN
2.0000 g | INTRAVENOUS | Status: AC
  Administered 2016-06-25: 2 g via INTRAVENOUS

## 2016-06-25 MED ORDER — LIDOCAINE-EPINEPHRINE 1 %-1:100000 IJ SOLN
INTRAMUSCULAR | Status: AC
  Filled 2016-06-25: qty 1

## 2016-06-25 MED ORDER — MENTHOL 3 MG MT LOZG: 1.0000 | LOZENGE | OROMUCOSAL | Status: DC | PRN

## 2016-06-25 MED ORDER — PANTOPRAZOLE SODIUM 40 MG IV SOLR
40.0000 mg | Freq: Every day | INTRAVENOUS | Status: DC
  Administered 2016-06-25: 40 mg via INTRAVENOUS
  Filled 2016-06-25: qty 40

## 2016-06-25 MED ORDER — THROMBIN 5000 UNITS EX SOLR
CUTANEOUS | Status: AC
  Filled 2016-06-25: qty 5000

## 2016-06-25 MED ORDER — 0.9 % SODIUM CHLORIDE (POUR BTL) OPTIME
TOPICAL | Status: DC | PRN
  Administered 2016-06-25: 1000 mL

## 2016-06-25 MED ORDER — METHOCARBAMOL 1000 MG/10ML IJ SOLN
500.0000 mg | Freq: Four times a day (QID) | INTRAVENOUS | Status: DC | PRN
  Filled 2016-06-25: qty 5

## 2016-06-25 MED ORDER — ONDANSETRON HCL 4 MG/2ML IJ SOLN
INTRAMUSCULAR | Status: AC
  Filled 2016-06-25: qty 2

## 2016-06-25 MED ORDER — PROPOFOL 10 MG/ML IV BOLUS
INTRAVENOUS | Status: AC
  Filled 2016-06-25: qty 20

## 2016-06-25 MED ORDER — LACTATED RINGERS IV SOLN
INTRAVENOUS | Status: DC
  Administered 2016-06-25: 10:00:00 via INTRAVENOUS

## 2016-06-25 MED ORDER — PRAZOSIN HCL 2 MG PO CAPS
4.0000 mg | ORAL_CAPSULE | Freq: Every day | ORAL | Status: DC
Start: 2016-06-25 — End: 2016-06-26
  Administered 2016-06-25: 4 mg via ORAL
  Filled 2016-06-25: qty 2

## 2016-06-25 MED ORDER — THROMBIN 20000 UNITS EX SOLR
CUTANEOUS | Status: DC | PRN
  Administered 2016-06-25: 11:00:00 via TOPICAL

## 2016-06-25 MED ORDER — THROMBIN 5000 UNITS EX SOLR
CUTANEOUS | Status: DC | PRN
  Administered 2016-06-25: 13:00:00 via TOPICAL

## 2016-06-25 MED ORDER — LACTATED RINGERS IV SOLN
INTRAVENOUS | Status: DC | PRN
  Administered 2016-06-25 (×2): via INTRAVENOUS

## 2016-06-25 MED ORDER — THROMBIN 20000 UNITS EX SOLR
CUTANEOUS | Status: AC
  Filled 2016-06-25: qty 20000

## 2016-06-25 MED ORDER — LAMOTRIGINE 150 MG PO TABS
400.0000 mg | ORAL_TABLET | Freq: Every day | ORAL | Status: DC
  Administered 2016-06-25: 400 mg via ORAL
  Filled 2016-06-25: qty 1

## 2016-06-25 MED ORDER — PHENYLEPHRINE HCL 10 MG/ML IJ SOLN
INTRAVENOUS | Status: DC | PRN
  Administered 2016-06-25: 10 ug/min via INTRAVENOUS

## 2016-06-25 MED ORDER — BISACODYL 10 MG RE SUPP: 10.0000 mg | Freq: Every day | RECTAL | Status: DC | PRN

## 2016-06-25 MED ORDER — CEFAZOLIN SODIUM-DEXTROSE 2-4 GM/100ML-% IV SOLN
2.0000 g | Freq: Three times a day (TID) | INTRAVENOUS | Status: AC
  Administered 2016-06-25 – 2016-06-26 (×2): 2 g via INTRAVENOUS
  Filled 2016-06-25 (×2): qty 100

## 2016-06-25 MED ORDER — PROPOFOL 10 MG/ML IV BOLUS
INTRAVENOUS | Status: DC | PRN
  Administered 2016-06-25: 150 mg via INTRAVENOUS

## 2016-06-25 MED ORDER — MIDAZOLAM HCL 5 MG/5ML IJ SOLN
INTRAMUSCULAR | Status: DC | PRN
  Administered 2016-06-25: 2 mg via INTRAVENOUS

## 2016-06-25 MED ORDER — LIDOCAINE-EPINEPHRINE 1 %-1:100000 IJ SOLN
INTRAMUSCULAR | Status: DC | PRN
  Administered 2016-06-25: 10 mL
  Administered 2016-06-25: 14 mL

## 2016-06-25 MED ORDER — METHOCARBAMOL 500 MG PO TABS
ORAL_TABLET | ORAL | Status: AC
  Filled 2016-06-25: qty 1

## 2016-06-25 MED ORDER — CEFAZOLIN SODIUM-DEXTROSE 2-4 GM/100ML-% IV SOLN
INTRAVENOUS | Status: AC
  Filled 2016-06-25: qty 100

## 2016-06-25 MED ORDER — BACITRACIN ZINC 500 UNIT/GM EX OINT
TOPICAL_OINTMENT | CUTANEOUS | Status: AC
  Filled 2016-06-25: qty 28.35

## 2016-06-25 MED ORDER — MIDAZOLAM HCL 2 MG/2ML IJ SOLN
INTRAMUSCULAR | Status: AC
  Filled 2016-06-25: qty 2

## 2016-06-25 MED ORDER — PHENYLEPHRINE HCL 10 MG/ML IJ SOLN
INTRAMUSCULAR | Status: DC | PRN
  Administered 2016-06-25 (×2): 80 ug via INTRAVENOUS

## 2016-06-25 MED ORDER — EPHEDRINE SULFATE 50 MG/ML IJ SOLN
INTRAMUSCULAR | Status: DC | PRN
  Administered 2016-06-25 (×2): 10 mg via INTRAVENOUS

## 2016-06-25 MED ORDER — LURASIDONE HCL 20 MG PO TABS
20.0000 mg | ORAL_TABLET | Freq: Every day | ORAL | Status: DC
  Administered 2016-06-25: 20 mg via ORAL
  Filled 2016-06-25: qty 1

## 2016-06-25 MED ORDER — ARTIFICIAL TEARS OPHTHALMIC OINT
TOPICAL_OINTMENT | OPHTHALMIC | Status: DC | PRN
  Administered 2016-06-25: 1 via OPHTHALMIC

## 2016-06-25 MED ORDER — DOCUSATE SODIUM 100 MG PO CAPS
100.0000 mg | ORAL_CAPSULE | Freq: Two times a day (BID) | ORAL | Status: DC
  Administered 2016-06-25 – 2016-06-26 (×2): 100 mg via ORAL
  Filled 2016-06-25 (×2): qty 1

## 2016-06-25 MED ORDER — ONDANSETRON HCL 4 MG/2ML IJ SOLN
INTRAMUSCULAR | Status: DC | PRN
  Administered 2016-06-25: 4 mg via INTRAVENOUS

## 2016-06-25 MED ORDER — DIAZEPAM 5 MG PO TABS
10.0000 mg | ORAL_TABLET | Freq: Every day | ORAL | Status: DC
  Administered 2016-06-25: 10 mg via ORAL
  Filled 2016-06-25: qty 2

## 2016-06-25 MED ORDER — IBUPROFEN 200 MG PO TABS
800.0000 mg | ORAL_TABLET | Freq: Three times a day (TID) | ORAL | Status: DC | PRN
  Administered 2016-06-25 – 2016-06-26 (×2): 800 mg via ORAL
  Filled 2016-06-25 (×2): qty 4

## 2016-06-25 MED ORDER — KCL IN DEXTROSE-NACL 20-5-0.45 MEQ/L-%-% IV SOLN: INTRAVENOUS | Status: DC

## 2016-06-25 MED ORDER — LIDOCAINE HCL (CARDIAC) 20 MG/ML IV SOLN
INTRAVENOUS | Status: DC | PRN
Start: 2016-06-25 — End: 2016-06-25
  Administered 2016-06-25: 60 mg via INTRAVENOUS

## 2016-06-25 MED ORDER — SODIUM CHLORIDE 0.9% FLUSH: 3.0000 mL | INTRAVENOUS | Status: DC | PRN

## 2016-06-25 MED ORDER — FLEET ENEMA 7-19 GM/118ML RE ENEM: 1.0000 | ENEMA | Freq: Once | RECTAL | Status: DC | PRN

## 2016-06-25 MED ORDER — ACETAMINOPHEN 650 MG RE SUPP
650.0000 mg | RECTAL | Status: DC | PRN
Start: 2016-06-25 — End: 2016-06-26

## 2016-06-25 MED ORDER — HYDROMORPHONE HCL 1 MG/ML IJ SOLN
0.2500 mg | INTRAMUSCULAR | Status: DC | PRN
  Administered 2016-06-25 (×2): 0.5 mg via INTRAVENOUS

## 2016-06-25 MED ORDER — SODIUM CHLORIDE 0.9 % IV SOLN: 250.0000 mL | INTRAVENOUS | Status: DC

## 2016-06-25 MED ORDER — ONDANSETRON HCL 4 MG PO TABS: 4.0000 mg | ORAL_TABLET | Freq: Four times a day (QID) | ORAL | Status: DC | PRN

## 2016-06-25 MED ORDER — SODIUM CHLORIDE 0.9% FLUSH
3.0000 mL | Freq: Two times a day (BID) | INTRAVENOUS | Status: DC
  Administered 2016-06-25: 3 mL via INTRAVENOUS

## 2016-06-25 MED ORDER — HYDROMORPHONE HCL 1 MG/ML IJ SOLN
INTRAMUSCULAR | Status: AC
  Filled 2016-06-25: qty 1

## 2016-06-25 MED ORDER — HYDROMORPHONE HCL 1 MG/ML IJ SOLN
0.5000 mg | INTRAMUSCULAR | Status: DC | PRN
  Administered 2016-06-25 – 2016-06-26 (×3): 0.5 mg via INTRAVENOUS
  Filled 2016-06-25 (×3): qty 0.5

## 2016-06-25 SURGICAL SUPPLY — 81 items
BLADE CLIPPER SURG (BLADE) ×3 IMPLANT
BLADE SURG 10 STRL SS (BLADE) ×3 IMPLANT
BLADE SURG 15 STRL LF DISP TIS (BLADE) IMPLANT
BLADE SURG 15 STRL SS (BLADE)
BOOT SUTURE AID YELLOW STND (SUTURE) IMPLANT
BUR MATCHSTICK NEURO 3.0 LAGG (BURR) ×3 IMPLANT
BUR PRECISION FLUTE 5.0 (BURR) ×3 IMPLANT
CANISTER SUCT 3000ML PPV (MISCELLANEOUS) ×3 IMPLANT
CARTRIDGE OIL MAESTRO DRILL (MISCELLANEOUS) ×1 IMPLANT
CORDS BIPOLAR (ELECTRODE) ×3 IMPLANT
COVER MAYO STAND STRL (DRAPES) IMPLANT
DECANTER SPIKE VIAL GLASS SM (MISCELLANEOUS) ×6 IMPLANT
DERMABOND ADVANCED (GAUZE/BANDAGES/DRESSINGS) ×6
DERMABOND ADVANCED .7 DNX12 (GAUZE/BANDAGES/DRESSINGS) ×3 IMPLANT
DEVICE IMPLANT NEUROSTIMINATOR (Neuro Prosthesis/Implant) ×1 IMPLANT
DEVICE IMPLANT NEUROSTIMULATOR (Neuro Prosthesis/Implant) ×2 IMPLANT
DIFFUSER DRILL AIR PNEUMATIC (MISCELLANEOUS) ×3 IMPLANT
DRAPE C-ARM 42X72 X-RAY (DRAPES) ×6 IMPLANT
DRAPE INCISE IOBAN 85X60 (DRAPES) ×3 IMPLANT
DRAPE LAPAROTOMY 100X72X124 (DRAPES) ×3 IMPLANT
DRAPE LAPAROTOMY T 98X78 PEDS (DRAPES) ×3 IMPLANT
DRAPE POUCH INSTRU U-SHP 10X18 (DRAPES) ×6 IMPLANT
DRAPE SURG 17X23 STRL (DRAPES) ×3 IMPLANT
DRSG OPSITE POSTOP 4X6 (GAUZE/BANDAGES/DRESSINGS) ×6 IMPLANT
DRSG OPSITE POSTOP 4X8 (GAUZE/BANDAGES/DRESSINGS) ×3 IMPLANT
DRSG PAD ABDOMINAL 8X10 ST (GAUZE/BANDAGES/DRESSINGS) IMPLANT
ELECT REM PT RETURN 9FT ADLT (ELECTROSURGICAL) ×6
ELECTRODE REM PT RTRN 9FT ADLT (ELECTROSURGICAL) ×2 IMPLANT
GAUZE SPONGE 4X4 12PLY STRL (GAUZE/BANDAGES/DRESSINGS) IMPLANT
GAUZE SPONGE 4X4 16PLY XRAY LF (GAUZE/BANDAGES/DRESSINGS) ×3 IMPLANT
GLOVE BIO SURGEON STRL SZ8 (GLOVE) ×6 IMPLANT
GLOVE BIOGEL PI IND STRL 8 (GLOVE) ×2 IMPLANT
GLOVE BIOGEL PI IND STRL 8.5 (GLOVE) ×2 IMPLANT
GLOVE BIOGEL PI INDICATOR 8 (GLOVE) ×4
GLOVE BIOGEL PI INDICATOR 8.5 (GLOVE) ×4
GLOVE ECLIPSE 8.0 STRL XLNG CF (GLOVE) ×6 IMPLANT
GLOVE EXAM NITRILE LRG STRL (GLOVE) IMPLANT
GLOVE EXAM NITRILE XL STR (GLOVE) IMPLANT
GLOVE EXAM NITRILE XS STR PU (GLOVE) IMPLANT
GOWN STRL REUS W/ TWL LRG LVL3 (GOWN DISPOSABLE) IMPLANT
GOWN STRL REUS W/ TWL XL LVL3 (GOWN DISPOSABLE) ×2 IMPLANT
GOWN STRL REUS W/TWL 2XL LVL3 (GOWN DISPOSABLE) ×6 IMPLANT
GOWN STRL REUS W/TWL LRG LVL3 (GOWN DISPOSABLE)
GOWN STRL REUS W/TWL XL LVL3 (GOWN DISPOSABLE) ×4
HEMOSTAT POWDER SURGIFOAM 1G (HEMOSTASIS) ×3 IMPLANT
KIT BASIN OR (CUSTOM PROCEDURE TRAY) ×6 IMPLANT
KIT NEUROSTIMULATOR ACCESSORY (KITS) ×3 IMPLANT
KIT PASSING ELEVATOR (KITS) ×3 IMPLANT
KIT ROOM TURNOVER OR (KITS) ×6 IMPLANT
NEEDLE HYPO 18GX1.5 BLUNT FILL (NEEDLE) IMPLANT
NEEDLE HYPO 25X1 1.5 SAFETY (NEEDLE) ×6 IMPLANT
NS IRRIG 1000ML POUR BTL (IV SOLUTION) ×6 IMPLANT
OIL CARTRIDGE MAESTRO DRILL (MISCELLANEOUS) ×3
PACK EENT II TURBAN DRAPE (CUSTOM PROCEDURE TRAY) ×3 IMPLANT
PACK LAMINECTOMY NEURO (CUSTOM PROCEDURE TRAY) ×3 IMPLANT
PAD ARMBOARD 7.5X6 YLW CONV (MISCELLANEOUS) ×3 IMPLANT
PATTIES SURGICAL .5 X.5 (GAUZE/BANDAGES/DRESSINGS) IMPLANT
PATTIES SURGICAL 1X1 (DISPOSABLE) IMPLANT
PENCIL BUTTON HOLSTER BLD 10FT (ELECTRODE) ×3 IMPLANT
RECHARGER INTELLIS (NEUROSURGERY SUPPLIES) ×2 IMPLANT
REPROGRAMMER INTELLIS (NEUROSURGERY SUPPLIES) ×2 IMPLANT
SPONGE LAP 4X18 X RAY DECT (DISPOSABLE) ×3 IMPLANT
SPONGE SURGIFOAM ABS GEL SZ50 (HEMOSTASIS) ×3 IMPLANT
STAPLER SKIN PROX WIDE 3.9 (STAPLE) ×6 IMPLANT
STIMULATOR CORD SURESCAN MRI (Stimulator) ×3 IMPLANT
STIMULATOR WIRELESS 74X79X20MM (MISCELLANEOUS) ×2 IMPLANT
SUT BONE WAX W31G (SUTURE) IMPLANT
SUT SILK 0 TIES 10X30 (SUTURE) IMPLANT
SUT SILK 2 0 FS (SUTURE) ×3 IMPLANT
SUT SILK 2 0 TIES 10X30 (SUTURE) IMPLANT
SUT VIC AB 0 CT1 18XCR BRD8 (SUTURE) ×3 IMPLANT
SUT VIC AB 0 CT1 8-18 (SUTURE) ×6
SUT VIC AB 2-0 CP2 18 (SUTURE) ×9 IMPLANT
SUT VIC AB 3-0 SH 8-18 (SUTURE) ×3 IMPLANT
SYR 3ML LL SCALE MARK (SYRINGE) IMPLANT
SYR CONTROL 10ML LL (SYRINGE) ×3 IMPLANT
TOWEL GREEN STERILE (TOWEL DISPOSABLE) ×4 IMPLANT
TOWEL GREEN STERILE FF (TOWEL DISPOSABLE) ×6 IMPLANT
TUBE CONNECTING 12'X1/4 (SUCTIONS) ×1
TUBE CONNECTING 12X1/4 (SUCTIONS) ×2 IMPLANT
WATER STERILE IRR 1000ML POUR (IV SOLUTION) ×6 IMPLANT

## 2016-06-25 NOTE — Interval H&P Note (Signed)
History and Physical Interval Note:  06/25/2016 10:41 AM  Kayla Howard  has presented today for surgery, with the diagnosis of Radiculopathy, Lumbar region; Postlaminectomy syndrome  The various methods of treatment have been discussed with the patient and family. After consideration of risks, benefits and other options for treatment, the patient has consented to  Procedure(s) with comments: LUMBAR SPINAL CORD STIMULATOR INSERTION WITH PADDLE REVISION (N/A) - LUMBAR SPINAL CORD STIMULATOR INSERTION WITH PADDLE REVISION Removal of Implantable Pulse Generator (N/A) as a surgical intervention .  The patient's history has been reviewed, patient examined, no change in status, stable for surgery.  I have reviewed the patient's chart and labs.  Questions were answered to the patient's satisfaction.     Kayla Howard D

## 2016-06-25 NOTE — Progress Notes (Signed)
Stimulator turned on and pt turned it off after rep left

## 2016-06-25 NOTE — Progress Notes (Addendum)
Pt admitted to the unit from pacu; pt A&O x4; ambulated to the bed in room from stretcher in hallway; VSS: IV intact and transfusing; pt skin clean dry and intact with no open wounds or pressure ulcers except for back, right flank and abdominal surgical incisions clean, dry and intact with honeycomb dsg. No stain or active bleeding noted. Neuro wnl; MAE x4; pt oriented to the unit and room; fall/safety precaution/prevention education completed. Pt in bed with call light within reach and family at bedside. Will closely monitor. Arabella MerlesP. Amo Shunta Mclaurin RN.

## 2016-06-25 NOTE — Op Note (Signed)
06/25/2016  2:36 PM  PATIENT:  Kayla Howard  51 y.o. female  PRE-OPERATIVE DIAGNOSIS:  Radiculopathy, Lumbar region; Postlaminectomy syndrome, malfunction of spinal cord stimulator  POST-OPERATIVE DIAGNOSIS:   Radiculopathy, Lumbar region; Postlaminectomy syndrome, malfunction of spinal cord stimulator   PROCEDURE:  Procedure(s): LUMBAR SPINAL CORD STIMULATOR INSERTION AND  REMOVAL OF OLD PADDLE STIMULATOR (N/A) Removal of Implantable Pulse Generator (N/A) Insertion via thoracic laminectomy  SURGEON:  Surgeon(s) and Role:    Maeola Harman* Malek Skog, MD - Primary  PHYSICIAN ASSISTANT:   ASSISTANTS: Poteat, RN   ANESTHESIA:   general  EBL:  Total I/O In: 1500 [I.V.:1500] Out: 100 [Blood:100]  BLOOD ADMINISTERED:none  DRAINS: none   LOCAL MEDICATIONS USED:  MARCAINE    and LIDOCAINE   SPECIMEN:  No Specimen  DISPOSITION OF SPECIMEN:  N/A  COUNTS:  YES  TOURNIQUET:  * No tourniquets in log *  DICTATION: DICTATION: Patient is 51 year old woman with previous laminectomy with paddle lead with multiple contacts out leads with depleted IPG who has chronic pain. It was elected to place new device with laminectomy and placement of paddle lead.She presents for laminectomy and placement of spinal cord stimulator. The previous IPG was placed in the abdomen.  Procedure: Following smooth and uncomplicated induction of general endotracheal anesthesia the patient was placed in a supine position and the previous IPG was removed and leads disconnected.  The incision was closed with 2-0 and 3-0 vicryl sutures and a sterile occlusive dressing was placed with Dermabond and Opsite.  The patient was then turned into a prone position on the flat rolls. C-arm was used to mark the T10 level. Her right flank was also marked for revision of implantable pulse generator. Her back was then prepped and draped in the usual sterile fashion with Betadine scrub and Duraprep. Area of planned incision was infiltrated  with local lidocaine.   The previous anchoring site was reopened and anchors were removed.  The  previously placed extensions were then explanted.  The leads were then carefully dissected to the T 11 level and a laminectomy of T 11 was performed with exposure of previously placed paddle lead.  This was removed.  A new lead was placed (Vectris surgical paddle lead), tunneled to the subcutaneous pocket and hooked to the Intellis battery.  The 5 6 5  Medtronic paddle electrode was passed in the epidural space and was found to be well positioned within the midline up to the inferior aspect of T 8.  The fascia was closed. The tunneler was used to create a subcutaneous tract for the electrodes and these were then passed, anchored to the implantable pulse generator and connections were torqued appropriately. Redundant electrode was circularized beneath the IPG and a strain release loop was placed in the laminectomy incision site. Impedences were checked and multiple contacts were found to be out of range.  It was eventually decided that the lead was non-functional, so we replaced it, retunneled, hooked to new battery and confirmed that all contacts were in range.  Fascia was again closed prior to re-tunneling. The wounds were irrigated and closed with 2-0 and 3-0 Vicryl stitches and the wounds were dressed with Dermabond. Impedances were assessed and found to be correct. Final radiograph demonstrated that the lead was in the midline from the inferior most aspect of T 8 to the inferior aspect of T 10 in the midline. Patient was taken to recovery having tolerated procedure well counts were correct at the end of the case.  PLAN OF CARE: Admit to inpatient   PATIENT DISPOSITION:  PACU - hemodynamically stable.   Delay start of Pharmacological VTE agent (>24hrs) due to surgical blood loss or risk of bleeding: yes   

## 2016-06-25 NOTE — Anesthesia Procedure Notes (Signed)
Procedure Name: Intubation Date/Time: 06/25/2016 11:33 AM Performed by: Neldon Newport Pre-anesthesia Checklist: Timeout performed, Patient being monitored, Suction available, Emergency Drugs available and Patient identified Patient Re-evaluated:Patient Re-evaluated prior to inductionOxygen Delivery Method: Circle system utilized Preoxygenation: Pre-oxygenation with 100% oxygen Intubation Type: IV induction Ventilation: Mask ventilation without difficulty Laryngoscope Size: Mac and 3 Grade View: Grade I Tube type: Oral Tube size: 7.0 mm Number of attempts: 1 Placement Confirmation: breath sounds checked- equal and bilateral,  positive ETCO2 and ETT inserted through vocal cords under direct vision Secured at: 22 cm Tube secured with: Tape Dental Injury: Teeth and Oropharynx as per pre-operative assessment

## 2016-06-25 NOTE — Progress Notes (Signed)
Awake, alert, conversant.  MAEW with full strength.  No numbness.  Doing well.   

## 2016-06-25 NOTE — Brief Op Note (Signed)
06/25/2016  2:36 PM  PATIENT:  Kayla Howard  51 y.o. female  PRE-OPERATIVE DIAGNOSIS:  Radiculopathy, Lumbar region; Postlaminectomy syndrome, malfunction of spinal cord stimulator  POST-OPERATIVE DIAGNOSIS:   Radiculopathy, Lumbar region; Postlaminectomy syndrome, malfunction of spinal cord stimulator   PROCEDURE:  Procedure(s): LUMBAR SPINAL CORD STIMULATOR INSERTION AND  REMOVAL OF OLD PADDLE STIMULATOR (N/A) Removal of Implantable Pulse Generator (N/A) Insertion via thoracic laminectomy  SURGEON:  Surgeon(s) and Role:    * Saquoia Sianez, MD - Primary  PHYSICIAN ASSISTANT:   ASSISTANTS: Poteat, RN   ANESTHESIA:   general  EBL:  Total I/O In: 1500 [I.V.:1500] Out: 100 [Blood:100]  BLOOD ADMINISTERED:none  DRAINS: none   LOCAL MEDICATIONS USED:  MARCAINE    and LIDOCAINE   SPECIMEN:  No Specimen  DISPOSITION OF SPECIMEN:  N/A  COUNTS:  YES  TOURNIQUET:  * No tourniquets in log *  DICTATION: DICTATION: Patient is 51-year-old woman with previous laminectomy with paddle lead with multiple contacts out leads with depleted IPG who has chronic pain. It was elected to place new device with laminectomy and placement of paddle lead.She presents for laminectomy and placement of spinal cord stimulator. The previous IPG was placed in the abdomen.  Procedure: Following smooth and uncomplicated induction of general endotracheal anesthesia the patient was placed in a supine position and the previous IPG was removed and leads disconnected.  The incision was closed with 2-0 and 3-0 vicryl sutures and a sterile occlusive dressing was placed with Dermabond and Opsite.  The patient was then turned into a prone position on the flat rolls. C-arm was used to mark the T10 level. Her right flank was also marked for revision of implantable pulse generator. Her back was then prepped and draped in the usual sterile fashion with Betadine scrub and Duraprep. Area of planned incision was infiltrated  with local lidocaine.   The previous anchoring site was reopened and anchors were removed.  The  previously placed extensions were then explanted.  The leads were then carefully dissected to the T 11 level and a laminectomy of T 11 was performed with exposure of previously placed paddle lead.  This was removed.  A new lead was placed (Vectris surgical paddle lead), tunneled to the subcutaneous pocket and hooked to the Intellis battery.  The 5 6 5 Medtronic paddle electrode was passed in the epidural space and was found to be well positioned within the midline up to the inferior aspect of T 8.  The fascia was closed. The tunneler was used to create a subcutaneous tract for the electrodes and these were then passed, anchored to the implantable pulse generator and connections were torqued appropriately. Redundant electrode was circularized beneath the IPG and a strain release loop was placed in the laminectomy incision site. Impedences were checked and multiple contacts were found to be out of range.  It was eventually decided that the lead was non-functional, so we replaced it, retunneled, hooked to new battery and confirmed that all contacts were in range.  Fascia was again closed prior to re-tunneling. The wounds were irrigated and closed with 2-0 and 3-0 Vicryl stitches and the wounds were dressed with Dermabond. Impedances were assessed and found to be correct. Final radiograph demonstrated that the lead was in the midline from the inferior most aspect of T 8 to the inferior aspect of T 10 in the midline. Patient was taken to recovery having tolerated procedure well counts were correct at the end of the case.     PLAN OF CARE: Admit to inpatient   PATIENT DISPOSITION:  PACU - hemodynamically stable.   Delay start of Pharmacological VTE agent (>24hrs) due to surgical blood loss or risk of bleeding: yes   

## 2016-06-25 NOTE — Transfer of Care (Signed)
Immediate Anesthesia Transfer of Care Note  Patient: Kayla Howard  Procedure(s) Performed: Procedure(s): LUMBAR SPINAL CORD STIMULATOR INSERTION AND  REMOVAL OF OLD PADDLE STIMULATOR (N/A) Removal of Implantable Pulse Generator (N/A)  Patient Location: PACU  Anesthesia Type:General  Level of Consciousness: awake, alert  and oriented  Airway & Oxygen Therapy: Patient Spontanous Breathing and Patient connected to face mask oxygen  Post-op Assessment: Report given to RN, Post -op Vital signs reviewed and stable and Patient moving all extremities X 4  Post vital signs: Reviewed and stable  Last Vitals:  Vitals:   06/25/16 0923 06/25/16 0926  BP: 122/73   Pulse: 63   Resp: 18   Temp:  36.7 C    Last Pain:  Vitals:   06/25/16 0926  TempSrc: Oral         Complications: No apparent anesthesia complications

## 2016-06-25 NOTE — Anesthesia Preprocedure Evaluation (Addendum)
Anesthesia Evaluation  Patient identified by MRN, date of birth, ID band Patient awake    Reviewed: Allergy & Precautions, NPO status , Patient's Chart, lab work & pertinent test results  History of Anesthesia Complications (+) PONV and history of anesthetic complications  Airway Mallampati: I  TM Distance: >3 FB Neck ROM: full    Dental  (+) Teeth Intact, Dental Advidsory Given   Pulmonary pneumonia, resolved, former smoker,    Pulmonary exam normal breath sounds clear to auscultation       Cardiovascular negative cardio ROS Normal cardiovascular exam Rhythm:Regular Rate:Normal     Neuro/Psych PSYCHIATRIC DISORDERS Anxiety Depression PTSDRadiculopathy    GI/Hepatic negative GI ROS, Neg liver ROS,   Endo/Other    Renal/GU negative Renal ROS  negative genitourinary   Musculoskeletal Post Laminectomy syndrome Lumbar Radiculopathy- bilateral S/P spinal cord stim placement   Abdominal   Peds  Hematology negative hematology ROS (+)   Anesthesia Other Findings   Reproductive/Obstetrics                            Anesthesia Physical Anesthesia Plan  ASA: II  Anesthesia Plan: General   Post-op Pain Management:    Induction: Intravenous  Airway Management Planned: Oral ETT  Additional Equipment:   Intra-op Plan:   Post-operative Plan: Extubation in OR  Informed Consent: I have reviewed the patients History and Physical, chart, labs and discussed the procedure including the risks, benefits and alternatives for the proposed anesthesia with the patient or authorized representative who has indicated his/her understanding and acceptance.   Dental advisory given  Plan Discussed with: Anesthesiologist, CRNA and Surgeon  Anesthesia Plan Comments:        Anesthesia Quick Evaluation

## 2016-06-26 DIAGNOSIS — T85113A Breakdown (mechanical) of implanted electronic neurostimulator, generator, initial encounter: Secondary | ICD-10-CM | POA: Diagnosis not present

## 2016-06-26 DIAGNOSIS — M5489 Other dorsalgia: Secondary | ICD-10-CM | POA: Diagnosis not present

## 2016-06-26 MED ORDER — OXYCODONE-ACETAMINOPHEN 5-325 MG PO TABS
1.0000 | ORAL_TABLET | ORAL | Status: DC | PRN
  Administered 2016-06-26: 2 via ORAL
  Filled 2016-06-26: qty 2

## 2016-06-26 MED ORDER — METHOCARBAMOL 500 MG PO TABS: 500.0000 mg | ORAL_TABLET | Freq: Four times a day (QID) | ORAL | 0 refills | Status: DC | PRN

## 2016-06-26 MED ORDER — OXYCODONE-ACETAMINOPHEN 5-325 MG PO TABS: 1.0000 | ORAL_TABLET | ORAL | 0 refills | Status: DC | PRN

## 2016-06-26 NOTE — Discharge Summary (Signed)
Physician Discharge Summary  Patient ID: Kayla Howard MRN: [id number] DOB/AGE: 49-Feb-1967 49 y.o.  Admit date: 06/25/2016 Discharge date: 06/26/2016  Admission Diagnoses:  Radiculopathy, Lumbar region; Postlaminectomy syndrome, malfunction of spinal cord stimulator  Discharge Diagnoses:  Radiculopathy, Lumbar region; Postlaminectomy syndrome, malfunction of spinal cord stimulator Active Problems:   Failed back surgical syndrome   Discharged Condition: good  Hospital Course: Patient was admitted by Dr. Venetia MaxonStern who removed an old spinal cord stimulator placed a new spinal cord similar. She is doing well following surgery. She is asking to discharge to home. She's been given instructions regarding wound care and activities. She has been instructed to follow-up with Dr. Venetia MaxonStern at the office.  Discharge Exam: Blood pressure 95/66, pulse 61, temperature 98.2 F (36.8 C), resp. rate 18, weight 72.6 kg (160 lb), last menstrual period 05/28/2016, SpO2 100 %.  Disposition: 01-Home or Self Care  Discharge Instructions    Diet - low sodium heart healthy    Complete by:  As directed    Increase activity slowly    Complete by:  As directed      Allergies as of 06/26/2016      Reactions   Ambien [zolpidem Tartrate] Other (See Comments)   Flexeril [cyclobenzaprine] Itching   Morphine And Related    migraines      Medication List    TAKE these medications   diazepam 10 MG tablet Commonly known as:  VALIUM Take 1 tablet by mouth at bedtime.   ibuprofen 800 MG tablet Commonly known as:  ADVIL,MOTRIN Take 800 mg by mouth every 8 (eight) hours as needed for moderate pain.   lamoTRIgine 200 MG tablet Commonly known as:  LAMICTAL Take 2 tablets by mouth at bedtime.   LATUDA 40 MG Tabs tablet Generic drug:  lurasidone Take 0.5 tablets by mouth at bedtime.   methocarbamol 500 MG tablet Commonly known as:  ROBAXIN Take 1 tablet (500 mg total) by mouth every 6 (six) hours as needed for  muscle spasms.   oxyCODONE-acetaminophen 5-325 MG tablet Commonly known as:  PERCOCET/ROXICET Take 1-2 tablets by mouth every 4 (four) hours as needed (pain).   prazosin 2 MG capsule Commonly known as:  MINIPRESS Take 2 capsules by mouth at bedtime.   traZODone 100 MG tablet Commonly known as:  DESYREL Take 2 tablets by mouth at bedtime.        SignedHewitt Shorts: NUDELMAN,ROBERT W 06/26/2016, 8:50 AM

## 2016-06-26 NOTE — Progress Notes (Signed)
OT Cancellation Note  Patient Details Name: Kayla Howard MRN: [id number] DOB: [birth year]   Cancelled Treatment:    Reason Eval/Treat Not Completed: OT screened, no needs identified, will sign off. Pt able to complete ADL without assistance in hospital setting and demonstrates a good understanding of back precautions related to ADL. No acute OT needs identified. Will sign off. Thank you for this referral!  Doristine Sectionharity A Zeinab Rodwell, MS OTR/L  Pager: [phone]   [facility] Bagley Medical CenterCharity A Lindy Garczynski 06/26/2016, 9:43 AM

## 2016-06-26 NOTE — Evaluation (Signed)
Physical Therapy Evaluation Patient Details Name: Kayla Howard MRN: [id number] DOB: 1965-09-02 Today's Date: 06/26/2016   History of Present Illness  Pt is a 51 yo female admitted for lumbar spinal cord stimulator. PMH significant for multiple back surgeries and paddle stimulator insertion. HTN   Clinical Impression  Pt presents with the above diagnosis for therapy evaluation. 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 Pt is able to perform gait, balance, and transfers with Mod I to independent this session and is expected to return to baseline functioning. No further skilled acute PT needs are required at this time. Pt will sign off. Thank you for the referral.     Follow Up Recommendations No PT follow up    Equipment Recommendations  None recommended by PT    Recommendations for Other Services       Precautions / Restrictions Precautions Precautions: None Restrictions Weight Bearing Restrictions: No      Mobility  Bed Mobility Overal bed mobility: Independent                Transfers Overall transfer level: Modified independent                  Ambulation/Gait Ambulation/Gait assistance: Supervision Ambulation Distance (Feet): 500 Feet Assistive device: None Gait Pattern/deviations: Step-through pattern;Antalgic Gait velocity: decreased Gait velocity interpretation: Below normal speed for age/gender General Gait Details: mild antalgic gait with  minimal forward trunk lean  Stairs            Wheelchair Mobility    Modified Rankin (Stroke Patients Only)       Balance Overall balance assessment: No apparent balance deficits (not formally assessed)                                           Pertinent Vitals/Pain Pain Assessment: 0-10 Pain Score: 5  Pain Location: lumbar region with mobility Pain Descriptors / Indicators: Aching;Grimacing;Guarding Pain Intervention(s): Monitored during  session;Premedicated before session    Home Living Family/patient expects to be discharged to:: Private residence Living Arrangements: Spouse/significant other Available Help at Discharge: Family;Available PRN/intermittently Type of Home: House Home Access: Stairs to enter Entrance Stairs-Rails: None Entrance Stairs-Number of Steps: 2 Home Layout: One level        Prior Function Level of Independence: Independent         Comments: 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 Dominance   Dominant Hand: Right    Extremity/Trunk Assessment   Upper Extremity Assessment Upper Extremity Assessment: Overall WFL for tasks assessed    Lower Extremity Assessment Lower Extremity Assessment: Overall WFL for tasks assessed    Cervical / Trunk Assessment Cervical / Trunk Assessment: Normal  Communication   Communication: No difficulties  Cognition Arousal/Alertness: Awake/alert Behavior During Therapy: WFL for tasks assessed/performed Overall Cognitive Status: Within Functional Limits for tasks assessed                                        General Comments      Exercises     Assessment/Plan    PT Assessment Patent does not need any further PT services  PT Problem List         PT Treatment Interventions  PT Goals (Current goals can be found in the Care Plan section)  Acute Rehab PT Goals Patient Stated Goal: to get home today PT Goal Formulation: With patient Time For Goal Achievement: 06/27/16 Potential to Achieve Goals: Good    Frequency     Barriers to discharge        Co-evaluation               AM-PAC PT "6 Clicks" Daily Activity  Outcome Measure Difficulty turning over in bed (including adjusting bedclothes, sheets and blankets)?: None Difficulty moving from lying on back to sitting on the side of the bed? : None Difficulty sitting down on and standing up from a chair with arms (e.g., wheelchair, bedside commode, etc,.)?:  None Help needed moving to and from a bed to chair (including a wheelchair)?: None Help needed walking in hospital room?: None Help needed climbing 3-5 steps with a railing? : None 6 Click Score: 24    End of Session Equipment Utilized During Treatment: Gait belt Activity Tolerance: Patient tolerated treatment well Patient left: in chair;with call bell/phone within reach Nurse Communication: Mobility status PT Visit Diagnosis: Pain Pain - part of body:  (back)    Time: [id number] PT Time Calculation (min) (ACUTE ONLY): 16 min   Charges:   PT Evaluation $PT Eval Low Complexity: 1 Procedure     PT G Codes:        Colin BroachSabra M. Hebah Bogosian PT, DPT  [phone]   Ruel FavorsSabra Aletha HalimMarie Susann Lawhorne 06/26/2016, 1:10 PM

## 2016-06-26 NOTE — Progress Notes (Signed)
Pt discharge education and instructions completed with pt at bedside; pt voices understanding and denies any questions. Pt IV removed; pt discharge home with fiancee to transport her home. Pt surgical honeycomb incision dsgs remains clean, dry and intact with no stain or active bleeding noted. Pt handed her prescriptions for Percocet and Robaxin. Pt transported off unit via wheelchair with belongings and fiancee to the side. Dionne BucyP. Amo Kole Hilyard RN

## 2016-06-26 NOTE — Progress Notes (Signed)
Orthopedic Tech Progress Note Patient Details:  Kayla OdorSandra Howard [birth year] [id number]  Ortho Devices Type of Ortho Device: Abdominal binder Ortho Device/Splint Location: abdomen Ortho Device/Splint Interventions: Freeman CaldronOrdered   [name] 06/26/2016, 7:15 AM

## 2016-06-29 ENCOUNTER — Encounter (HOSPITAL_COMMUNITY): Payer: Self-pay | Admitting: Neurosurgery

## 2016-06-29 NOTE — Anesthesia Postprocedure Evaluation (Signed)
Anesthesia Post Note  Patient: Kayla Howard  Procedure(s) Performed: Procedure(s) (LRB): LUMBAR SPINAL CORD STIMULATOR INSERTION AND  REMOVAL OF OLD PADDLE STIMULATOR (N/A) Removal of Implantable Pulse Generator (N/A)  Patient location during evaluation: PACU Anesthesia Type: General Level of consciousness: awake and alert Pain management: pain level controlled Vital Signs Assessment: post-procedure vital signs reviewed and stable Respiratory status: spontaneous breathing, nonlabored ventilation, respiratory function stable and patient connected to nasal cannula oxygen Cardiovascular status: blood pressure returned to baseline and stable Postop Assessment: no signs of nausea or vomiting Anesthetic complications: no       Last Vitals:  Vitals:   06/26/16 0330 06/26/16 0744  BP: 95/60 95/66  Pulse: 69 61  Resp: 18 18  Temp: 36.6 C 36.8 C    Last Pain:  Vitals:   06/26/16 0532  TempSrc:   PainSc: 3                  Phillips Groutarignan, [place]

## 2016-07-05 DIAGNOSIS — Z299 Encounter for prophylactic measures, unspecified: Secondary | ICD-10-CM | POA: Diagnosis not present

## 2016-07-05 DIAGNOSIS — F39 Unspecified mood [affective] disorder: Secondary | ICD-10-CM | POA: Diagnosis not present

## 2016-07-05 DIAGNOSIS — Z6821 Body mass index (BMI) 21.0-21.9, adult: Secondary | ICD-10-CM | POA: Diagnosis not present

## 2016-07-05 DIAGNOSIS — M5136 Other intervertebral disc degeneration, lumbar region: Secondary | ICD-10-CM | POA: Diagnosis not present

## 2016-07-05 DIAGNOSIS — F4310 Post-traumatic stress disorder, unspecified: Secondary | ICD-10-CM | POA: Diagnosis not present

## 2016-07-05 DIAGNOSIS — F419 Anxiety disorder, unspecified: Secondary | ICD-10-CM | POA: Diagnosis not present

## 2016-09-03 NOTE — Progress Notes (Signed)
Late Entry G-Codes for Eval on 06/25/16    06/26/16 1258  PT G-Codes **NOT FOR INPATIENT CLASS**  Functional Assessment Tool Used AM-PAC 6 Clicks Basic Mobility;Clinical judgement  Functional Limitation Mobility: Walking and moving around  Mobility: Walking and Moving Around Current Status [phone]([id number]) CH  Mobility: Walking and Moving Around Goal Status [phone](G8979) CH  Mobility: Walking and Moving Around Discharge Status [phone](G8980) CH   Colin BroachSabra M. Zelma Mazariego PT, DPT  [phone]

## 2016-09-11 DIAGNOSIS — G8929 Other chronic pain: Secondary | ICD-10-CM | POA: Diagnosis not present

## 2016-09-11 DIAGNOSIS — Z87891 Personal history of nicotine dependence: Secondary | ICD-10-CM | POA: Diagnosis not present

## 2016-09-11 DIAGNOSIS — M25551 Pain in right hip: Secondary | ICD-10-CM | POA: Diagnosis not present

## 2016-09-11 DIAGNOSIS — R1031 Right lower quadrant pain: Secondary | ICD-10-CM | POA: Diagnosis not present

## 2016-09-15 DIAGNOSIS — F411 Generalized anxiety disorder: Secondary | ICD-10-CM | POA: Diagnosis not present

## 2016-09-15 DIAGNOSIS — F321 Major depressive disorder, single episode, moderate: Secondary | ICD-10-CM | POA: Diagnosis not present

## 2016-09-15 DIAGNOSIS — F4310 Post-traumatic stress disorder, unspecified: Secondary | ICD-10-CM | POA: Diagnosis not present

## 2016-10-07 DIAGNOSIS — M25551 Pain in right hip: Secondary | ICD-10-CM | POA: Diagnosis not present

## 2016-10-19 DIAGNOSIS — Z299 Encounter for prophylactic measures, unspecified: Secondary | ICD-10-CM | POA: Diagnosis not present

## 2016-10-19 DIAGNOSIS — F4310 Post-traumatic stress disorder, unspecified: Secondary | ICD-10-CM | POA: Diagnosis not present

## 2016-10-19 DIAGNOSIS — F419 Anxiety disorder, unspecified: Secondary | ICD-10-CM | POA: Diagnosis not present

## 2016-10-19 DIAGNOSIS — Z713 Dietary counseling and surveillance: Secondary | ICD-10-CM | POA: Diagnosis not present

## 2016-10-19 DIAGNOSIS — Z6822 Body mass index (BMI) 22.0-22.9, adult: Secondary | ICD-10-CM | POA: Diagnosis not present

## 2016-11-11 DIAGNOSIS — M545 Low back pain: Secondary | ICD-10-CM | POA: Diagnosis not present

## 2016-11-11 DIAGNOSIS — Z9689 Presence of other specified functional implants: Secondary | ICD-10-CM | POA: Diagnosis not present

## 2016-11-11 DIAGNOSIS — M25551 Pain in right hip: Secondary | ICD-10-CM | POA: Diagnosis not present

## 2016-11-11 DIAGNOSIS — M5412 Radiculopathy, cervical region: Secondary | ICD-10-CM | POA: Diagnosis not present

## 2016-12-15 DIAGNOSIS — F321 Major depressive disorder, single episode, moderate: Secondary | ICD-10-CM | POA: Diagnosis not present

## 2016-12-15 DIAGNOSIS — F411 Generalized anxiety disorder: Secondary | ICD-10-CM | POA: Diagnosis not present

## 2016-12-15 DIAGNOSIS — F4310 Post-traumatic stress disorder, unspecified: Secondary | ICD-10-CM | POA: Diagnosis not present

## 2017-01-30 DIAGNOSIS — Z23 Encounter for immunization: Secondary | ICD-10-CM | POA: Diagnosis not present

## 2017-02-17 DIAGNOSIS — M25551 Pain in right hip: Secondary | ICD-10-CM | POA: Diagnosis not present

## 2017-03-04 DIAGNOSIS — Z87891 Personal history of nicotine dependence: Secondary | ICD-10-CM | POA: Diagnosis not present

## 2017-03-04 DIAGNOSIS — M25551 Pain in right hip: Secondary | ICD-10-CM | POA: Diagnosis not present

## 2017-03-04 DIAGNOSIS — Z79899 Other long term (current) drug therapy: Secondary | ICD-10-CM | POA: Diagnosis not present

## 2017-03-04 DIAGNOSIS — M160 Bilateral primary osteoarthritis of hip: Secondary | ICD-10-CM | POA: Diagnosis not present

## 2017-03-04 DIAGNOSIS — G8929 Other chronic pain: Secondary | ICD-10-CM | POA: Diagnosis not present

## 2017-03-07 DIAGNOSIS — M25551 Pain in right hip: Secondary | ICD-10-CM | POA: Diagnosis not present

## 2017-03-07 DIAGNOSIS — R030 Elevated blood-pressure reading, without diagnosis of hypertension: Secondary | ICD-10-CM | POA: Diagnosis not present

## 2017-03-08 DIAGNOSIS — F411 Generalized anxiety disorder: Secondary | ICD-10-CM | POA: Diagnosis not present

## 2017-03-08 DIAGNOSIS — F321 Major depressive disorder, single episode, moderate: Secondary | ICD-10-CM | POA: Diagnosis not present

## 2017-03-08 DIAGNOSIS — F4310 Post-traumatic stress disorder, unspecified: Secondary | ICD-10-CM | POA: Diagnosis not present

## 2017-03-14 DIAGNOSIS — M25551 Pain in right hip: Secondary | ICD-10-CM | POA: Diagnosis not present

## 2017-05-31 DIAGNOSIS — F411 Generalized anxiety disorder: Secondary | ICD-10-CM | POA: Diagnosis not present

## 2017-05-31 DIAGNOSIS — F4310 Post-traumatic stress disorder, unspecified: Secondary | ICD-10-CM | POA: Diagnosis not present

## 2017-05-31 DIAGNOSIS — F321 Major depressive disorder, single episode, moderate: Secondary | ICD-10-CM | POA: Diagnosis not present

## 2017-06-22 DIAGNOSIS — M25551 Pain in right hip: Secondary | ICD-10-CM | POA: Diagnosis not present

## 2017-06-23 DIAGNOSIS — Z299 Encounter for prophylactic measures, unspecified: Secondary | ICD-10-CM | POA: Diagnosis not present

## 2017-06-23 DIAGNOSIS — M47816 Spondylosis without myelopathy or radiculopathy, lumbar region: Secondary | ICD-10-CM | POA: Diagnosis not present

## 2017-06-23 DIAGNOSIS — Z01818 Encounter for other preprocedural examination: Secondary | ICD-10-CM | POA: Diagnosis not present

## 2017-06-23 DIAGNOSIS — F39 Unspecified mood [affective] disorder: Secondary | ICD-10-CM | POA: Diagnosis not present

## 2017-06-23 DIAGNOSIS — Z6824 Body mass index (BMI) 24.0-24.9, adult: Secondary | ICD-10-CM | POA: Diagnosis not present

## 2017-07-11 DIAGNOSIS — M549 Dorsalgia, unspecified: Secondary | ICD-10-CM

## 2017-07-11 DIAGNOSIS — F4310 Post-traumatic stress disorder, unspecified: Secondary | ICD-10-CM | POA: Diagnosis present

## 2017-07-11 DIAGNOSIS — G8929 Other chronic pain: Secondary | ICD-10-CM | POA: Diagnosis present

## 2017-07-11 DIAGNOSIS — M1611 Unilateral primary osteoarthritis, right hip: Secondary | ICD-10-CM | POA: Diagnosis not present

## 2017-07-11 DIAGNOSIS — F419 Anxiety disorder, unspecified: Secondary | ICD-10-CM | POA: Diagnosis present

## 2017-07-11 NOTE — H&P (Signed)
HIP ARTHROPLASTY ADMISSION H&P  Patient ID: Kayla Howard MRN: [id number] DOB/AGE: [birth year] 52 y.o.  Chief Complaint: right hip pain.  Planned Procedure Date: 08/02/2017 Medical and cardiac clearance by Dr. Sherril Croon     HPI: Kayla Howard is a 52 y.o. female with a history of anxiety, PTSD, and chronic back pain status post lumbar surgery and spinal cord stimulator placement who presents for evaluation of OA RIGHT HIP. The patient has a history of pain and functional disability in the right hip due to arthritis and has failed non-surgical conservative treatments for greater than 12 weeks to include NSAID's and/or analgesics, corticosteriod injections, use of assistive devices and activity modification.  Onset of symptoms was gradual, starting >10 years ago with gradually worsening course since that time. Patient currently rates pain at 9 out of 10 with activity. Patient has night pain, worsening of pain with activity and weight bearing and pain that interferes with activities of daily living.  Patient has evidence of periarticular osteophytes and joint space narrowing by imaging studies.  There is no active infection.  Past Medical History:  Diagnosis Date  . Anxiety    takes Valium nightly  . Chronic back pain    several back surgeries  . Complication of anesthesia    gets very anxious after anesthesia  . Depression   . History of bronchitis 2005  . History of migraine    last one 6 months ago  . Insomnia    takes Trazodone nightly  . Pneumonia    hx of 2005  . PTSD (post-traumatic stress disorder)    takes Lamictal,Minipress,and Latuda nightly   Past Surgical History:  Procedure Laterality Date  . BACK SURGERY     x2  . battery replacement to spinal cord stimulator     . epidural injections    . ganglion cyst removed from right wrist    . PAIN PUMP REMOVAL N/A 06/25/2016   Procedure: Removal of Implantable Pulse Generator;  Surgeon: Maeola Harman, MD;  Location: [facility] OR;  Service:  Neurosurgery;  Laterality: N/A;  . rotator cuff surgery Right   . SPINAL CORD STIMULATOR INSERTION    . SPINAL CORD STIMULATOR INSERTION N/A 06/25/2016   Procedure: LUMBAR SPINAL CORD STIMULATOR INSERTION AND  REMOVAL OF OLD PADDLE STIMULATOR;  Surgeon: Maeola Harman, MD;  Location: [facility] OR;  Service: Neurosurgery;  Laterality: N/A;  . warts removed from knees  70's  . wisdom teeth extracted      Medications: Lamictal 400 mg at bedtime Trazodone 200 mg at bedtime Latuda 80 mg at bedtime Prazosin 2 mg at bedtime Valium 10 mg at bedtime  Allergies  Allergen Reactions  . Ambien [Zolpidem Tartrate] Other (See Comments)  . Flexeril [Cyclobenzaprine] Itching  . Morphine And Related     migraines   Social history: Single, former smoker.  One pack per day x10 years.  Quit 20 years ago.  Rare EtOH.  Family History  Problem Relation Age of Onset  . Hypertension Mother   . Diabetes Father     ROS: Currently denies lightheadedness, dizziness, Fever, chills, CP, SOB.   No personal history of DVT, PE, MI, or CVA. No loose teeth or dentures All other systems have been reviewed and were otherwise currently negative with the exception of those mentioned in the HPI and as above.  Objective: Vitals: Ht: 5 feet 11 inches wt: 167.4 pounds temp: 98.1 BP: 134/79 pulse: 81 O2 97 % on room air.   Physical Exam: General: Alert, NAD.  Trendelenberg Gait  HEENT: EOMI, Good Neck Extension  Pulm: No increased work of breathing.  Clear B/L A/P w/o crackle or wheeze.  CV: RRR, No m/g/r appreciated  GI: soft, NT, ND Neuro: Neuro without gross focal deficit.  Sensation intact distally Skin: No lesions in the area of chief complaint MSK/Surgical Site: Right hip pain with passive ROM.  Positive Stinchfield.  5/5 strength.  NVI.  Sensation intact distally.  Imaging Review Plain radiographs demonstrate severe degenerative joint disease of the right hip.   Preoperative templating of the joint replacement has  been completed, documented, and submitted to the Operating Room personnel in order to optimize intra-operative equipment management.  Assessment: OA RIGHT HIP Principal Problem:   Primary osteoarthritis of right hip Active Problems:   Anxiety   Chronic back pain   PTSD (post-traumatic stress disorder)   Plan: Plan for Procedure(s): RIGHT TOTAL HIP ARTHROPLASTY ANTERIOR APPROACH  The patient history, physical exam, clinical judgement of the provider and imaging are consistent with end stage degenerative joint disease and total joint arthroplasty is deemed medically necessary. The treatment options including medical management, injection therapy, and arthroplasty were discussed at length. The risks and benefits of Procedure(s): RIGHT TOTAL HIP ARTHROPLASTY ANTERIOR APPROACH were presented and reviewed.  The risks of nonoperative treatment, versus surgical intervention including but not limited to continued pain, aseptic loosening, stiffness, dislocation/subluxation, infection, bleeding, nerve injury, blood clots, cardiopulmonary complications, morbidity, mortality, among others were discussed. The patient verbalizes understanding and wishes to proceed with the plan.  Patient is being admitted for inpatient treatment for surgery, pain control, PT, OT, prophylactic antibiotics, VTE prophylaxis, progressive ambulation, ADL's and discharge planning.   Dental prophylaxis discussed and recommended for 2 years postoperatively.   The patient does meet the criteria for TXA which will be used perioperatively.    ASA 81 mg BID will be used postoperatively for DVT prophylaxis in addition to SCDs, and early ambulation.  No gabapentin - it makes her feel out of it  Dilaudid for IV breakthrough pain-morphine causes migraines.  Oxycodone/Tylenol for p.o. pain medicine  Robaxin for spasm  Continue IBU for inflamation  The patient is planning to be discharged home in care of her sister and fiance.     Kayla BilletHenry Calvin Martensen III, PA-C 07/11/2017 3:45 PM

## 2017-07-21 NOTE — Pre-Procedure Instructions (Signed)
Kayla OdorSandra Lancon  07/21/2017      [facility] [place] - [address] [place] [id number] Phone: [phone] Fax: [phone]    Your procedure is scheduled on July 2  [facility] Ad[facility] at 0530 A.M.  Call this number if you have problems the morning of surgery:  [phone]   Remember:  NOTHING TO EAT OR DRINK AFTER MIDNIGHT    Take these medicines the morning of surgery with A SIP OF WATER NONE  7 days prior to surgery STOP taking any Aspirin(unless otherwise instructed by your surgeon), Aleve, Naproxen, Ibuprofen, Motrin, Advil, Goody's, BC's, all herbal medications, fish oil, and all vitamins     Do not wear jewelry, make-up or nail polish.  Do not wear lotions, powders, or perfumes, or deodorant.  Do not shave 48 hours prior to surgery.    Do not bring valuables to the hospital.  [facility] is not responsible for any belongings or valuables.  Contacts, dentures or bridgework may not be worn into surgery.  Leave your suitcase in the car.  After surgery it may be brought to your room.  For patients admitted to the hospital, discharge time will be determined by your treatment team.  Patients discharged the day of surgery will not be allowed to drive home.    Special instructions:   [place]- Preparing For Surgery  Before surgery, you can play an important role. Because skin is not sterile, your skin needs to be as free of germs as possible. You can reduce the number of germs on your skin by washing with CHG (chlorahexidine gluconate) Soap before surgery.  CHG is an antiseptic cleaner which kills germs and bonds with the skin to continue killing germs even after washing.    Oral Hygiene is also important to reduce your risk of infection.  Remember - BRUSH YOUR TEETH THE MORNING OF SURGERY WITH YOUR REGULAR TOOTHPASTE  Please do not use if you have an allergy to CHG or antibacterial soaps. If your  skin becomes reddened/irritated stop using the CHG.  Do not shave (including legs and underarms) for at least 48 hours prior to first CHG shower. It is OK to shave your face.  Please follow these instructions carefully.   1. Shower the NIGHT BEFORE SURGERY and the MORNING OF SURGERY with CHG.   2. If you chose to wash your hair, wash your hair first as usual with your normal shampoo.  3. After you shampoo, rinse your hair and body thoroughly to remove the shampoo.  4. Use CHG as you would any other liquid soap. You can apply CHG directly to the skin and wash gently with a scrungie or a clean washcloth.   5. Apply the CHG Soap to your body ONLY FROM THE NECK DOWN.  Do not use on open wounds or open sores. Avoid contact with your eyes, ears, mouth and genitals (private parts). Wash Face and genitals (private parts)  with your normal soap.  6. Wash thoroughly, paying special attention to the area where your surgery will be performed.  7. Thoroughly rinse your body with warm water from the neck down.  8. DO NOT shower/wash with your normal soap after using and rinsing off the CHG Soap.  9. Pat yourself dry with a CLEAN TOWEL.  10. Wear CLEAN PAJAMAS to bed the night before surgery, wear comfortable clothes the morning of surgery  11.  Place CLEAN SHEETS on your bed the night of your first shower and DO NOT SLEEP WITH PETS.    Day of Surgery:  Do not apply any deodorants/lotions.  Please wear clean clothes to the hospital/surgery center.   Remember to brush your teeth WITH YOUR REGULAR TOOTHPASTE.    Please read over the following fact sheets that you were given.

## 2017-07-22 ENCOUNTER — Other Ambulatory Visit: Payer: Self-pay

## 2017-07-22 ENCOUNTER — Encounter (HOSPITAL_COMMUNITY)
Admission: RE | Admit: 2017-07-22 | Discharge: 2017-07-22 | Disposition: A | Discharge status: Home / Self Care | Payer: Medicare Other | Source: Ambulatory Visit | Attending: Orthopedic Surgery | Admitting: Orthopedic Surgery

## 2017-07-22 ENCOUNTER — Encounter (HOSPITAL_COMMUNITY): Payer: Self-pay

## 2017-07-22 DIAGNOSIS — Z01812 Encounter for preprocedural laboratory examination: Secondary | ICD-10-CM | POA: Insufficient documentation

## 2017-07-22 LAB — CBC
HEMATOCRIT: 37.6 % (ref 36.0–46.0)
Hemoglobin: 12.5 g/dL (ref 12.0–15.0)
MCH: 30.5 pg (ref 26.0–34.0)
MCHC: 33.2 g/dL (ref 30.0–36.0)
MCV: 91.7 fL (ref 78.0–100.0)
PLATELETS: 246 10*3/uL (ref 150–400)
RBC: 4.1 MIL/uL (ref 3.87–5.11)
RDW: 11.5 % (ref 11.5–15.5)
WBC: 8.6 10*3/uL (ref 4.0–10.5)

## 2017-07-22 LAB — BASIC METABOLIC PANEL
ANION GAP: 9 (ref 5–15)
BUN: 11 mg/dL (ref 6–20)
CO2: 26 mmol/L (ref 22–32)
Calcium: 9.1 mg/dL (ref 8.9–10.3)
Chloride: 105 mmol/L (ref 101–111)
Creatinine, Ser: 0.74 mg/dL (ref 0.44–1.00)
GFR calc Af Amer: >60 mL/min (ref >60)
GFR calc non Af Amer: >60 mL/min (ref >60)
GLUCOSE: 100 mg/dL — AB (ref 65–99)
POTASSIUM: 3.7 mmol/L (ref 3.5–5.1)
Sodium: 140 mmol/L (ref 135–145)

## 2017-07-22 LAB — SURGICAL PCR SCREEN
MRSA, PCR: NEGATIVE
Staphylococcus aureus: NEGATIVE

## 2017-07-22 NOTE — Progress Notes (Signed)
PCP - [facility] Cardiologist - denies  Chest x-ray - denies EKG - denies Stress Test - denies ECHO - denies Cardiac Cath - denies   Anesthesia review: NO  Patient denies shortness of breath, fever, cough and chest pain at PAT appointment   Patient verbalized understanding of instructions that were given to them at the PAT appointment. Patient was also instructed that they will need to review over the PAT instructions again at home before surgery.

## 2017-07-27 ENCOUNTER — Other Ambulatory Visit: Payer: Self-pay | Admitting: Orthopedic Surgery

## 2017-07-27 NOTE — Care Plan (Signed)
Spoke with patient. She plans to discharge to home with family and HHPT. I have ordered her equipment to be delivered to her room prior to dc. Plan as follows:   HHPT - [facility] for 5 HHPT visits  OPPT - [facility] [place]  08/18/17 if needed  MD follow up:  Dr. Eulah PontMurphy - 08/17/17 @ 415.   Patient is aware of plan and agreeable.   Please contact Shauna HughRenee Angiulli, RNCM [phone] with questions or if this plan needs to change   Thanks

## 2017-08-01 MED ORDER — BUPIVACAINE LIPOSOME 1.3 % IJ SUSP
20.0000 mL | INTRAMUSCULAR | Status: DC
  Filled 2017-08-01: qty 20

## 2017-08-01 MED ORDER — TRANEXAMIC ACID 1000 MG/10ML IV SOLN
2000.0000 mg | INTRAVENOUS | Status: AC
  Administered 2017-08-02: 2000 mg via TOPICAL
  Filled 2017-08-01: qty 20

## 2017-08-01 MED ORDER — TRANEXAMIC ACID 1000 MG/10ML IV SOLN
1000.0000 mg | INTRAVENOUS | Status: AC
  Administered 2017-08-02: 1000 mg via INTRAVENOUS
  Filled 2017-08-01: qty 1100

## 2017-08-02 ENCOUNTER — Inpatient Hospital Stay (HOSPITAL_COMMUNITY)
Admission: RE | Admit: 2017-08-02 | Discharge: 2017-08-03 | DRG: 470 | Disposition: A | Discharge status: Home Health | Payer: Medicare Other | Source: Ambulatory Visit | Attending: Orthopedic Surgery | Admitting: Orthopedic Surgery

## 2017-08-02 ENCOUNTER — Inpatient Hospital Stay (HOSPITAL_COMMUNITY): Payer: Medicare Other | Admitting: Certified Registered Nurse Anesthetist

## 2017-08-02 ENCOUNTER — Other Ambulatory Visit: Payer: Self-pay

## 2017-08-02 ENCOUNTER — Encounter (HOSPITAL_COMMUNITY)
Admission: RE | Disposition: A | Discharge status: Home Health | Payer: Self-pay | Source: Ambulatory Visit | Attending: Orthopedic Surgery

## 2017-08-02 ENCOUNTER — Inpatient Hospital Stay (HOSPITAL_COMMUNITY): Payer: Medicare Other

## 2017-08-02 ENCOUNTER — Encounter (HOSPITAL_COMMUNITY): Payer: Self-pay | Admitting: General Practice

## 2017-08-02 DIAGNOSIS — M549 Dorsalgia, unspecified: Secondary | ICD-10-CM | POA: Diagnosis present

## 2017-08-02 DIAGNOSIS — Z419 Encounter for procedure for purposes other than remedying health state, unspecified: Secondary | ICD-10-CM

## 2017-08-02 DIAGNOSIS — G4700 Insomnia, unspecified: Secondary | ICD-10-CM | POA: Diagnosis present

## 2017-08-02 DIAGNOSIS — M1611 Unilateral primary osteoarthritis, right hip: Principal | ICD-10-CM | POA: Diagnosis present

## 2017-08-02 DIAGNOSIS — Z96641 Presence of right artificial hip joint: Secondary | ICD-10-CM | POA: Diagnosis not present

## 2017-08-02 DIAGNOSIS — Z471 Aftercare following joint replacement surgery: Secondary | ICD-10-CM | POA: Diagnosis not present

## 2017-08-02 DIAGNOSIS — F329 Major depressive disorder, single episode, unspecified: Secondary | ICD-10-CM | POA: Diagnosis present

## 2017-08-02 DIAGNOSIS — F419 Anxiety disorder, unspecified: Secondary | ICD-10-CM | POA: Diagnosis present

## 2017-08-02 DIAGNOSIS — Z885 Allergy status to narcotic agent status: Secondary | ICD-10-CM | POA: Diagnosis not present

## 2017-08-02 DIAGNOSIS — F4312 Post-traumatic stress disorder, chronic: Secondary | ICD-10-CM | POA: Diagnosis not present

## 2017-08-02 DIAGNOSIS — G8929 Other chronic pain: Secondary | ICD-10-CM | POA: Diagnosis present

## 2017-08-02 DIAGNOSIS — Z79899 Other long term (current) drug therapy: Secondary | ICD-10-CM | POA: Diagnosis not present

## 2017-08-02 DIAGNOSIS — F4310 Post-traumatic stress disorder, unspecified: Secondary | ICD-10-CM | POA: Diagnosis present

## 2017-08-02 DIAGNOSIS — Z888 Allergy status to other drugs, medicaments and biological substances status: Secondary | ICD-10-CM

## 2017-08-02 DIAGNOSIS — Z87891 Personal history of nicotine dependence: Secondary | ICD-10-CM

## 2017-08-02 HISTORY — PX: TOTAL HIP ARTHROPLASTY: SHX124

## 2017-08-02 LAB — ABO/RH: ABO/RH(D): O NEG

## 2017-08-02 LAB — TYPE AND SCREEN
ABO/RH(D): O NEG
ANTIBODY SCREEN: NEGATIVE

## 2017-08-02 LAB — POCT PREGNANCY, URINE: Preg Test, Ur: NEGATIVE

## 2017-08-02 SURGERY — ARTHROPLASTY, HIP, TOTAL, ANTERIOR APPROACH
Anesthesia: General | Site: Hip | Laterality: Right

## 2017-08-02 MED ORDER — POLYETHYLENE GLYCOL 3350 17 G PO PACK: 17.0000 g | PACK | Freq: Every day | ORAL | Status: DC | PRN

## 2017-08-02 MED ORDER — FENTANYL CITRATE (PF) 100 MCG/2ML IJ SOLN
25.0000 ug | INTRAMUSCULAR | Status: DC | PRN
  Administered 2017-08-02: 50 ug via INTRAVENOUS

## 2017-08-02 MED ORDER — ONDANSETRON HCL 4 MG PO TABS: 4.0000 mg | ORAL_TABLET | Freq: Three times a day (TID) | ORAL | 0 refills | Status: DC | PRN

## 2017-08-02 MED ORDER — MENTHOL 3 MG MT LOZG: 1.0000 | LOZENGE | OROMUCOSAL | Status: DC | PRN

## 2017-08-02 MED ORDER — PROPOFOL 10 MG/ML IV BOLUS
INTRAVENOUS | Status: DC | PRN
  Administered 2017-08-02: 30 mg via INTRAVENOUS
  Administered 2017-08-02: 40 mg via INTRAVENOUS

## 2017-08-02 MED ORDER — 0.9 % SODIUM CHLORIDE (POUR BTL) OPTIME
TOPICAL | Status: DC | PRN
  Administered 2017-08-02: 1000 mL

## 2017-08-02 MED ORDER — TRAZODONE HCL 150 MG PO TABS
300.0000 mg | ORAL_TABLET | Freq: Every day | ORAL | Status: DC
  Administered 2017-08-02: 300 mg via ORAL
  Filled 2017-08-02: qty 2

## 2017-08-02 MED ORDER — DEXAMETHASONE SODIUM PHOSPHATE 10 MG/ML IJ SOLN
10.0000 mg | Freq: Once | INTRAMUSCULAR | Status: AC
  Administered 2017-08-03: 10 mg via INTRAVENOUS
  Filled 2017-08-02: qty 1

## 2017-08-02 MED ORDER — DEXAMETHASONE SODIUM PHOSPHATE 10 MG/ML IJ SOLN
INTRAMUSCULAR | Status: DC | PRN
  Administered 2017-08-02: 10 mg via INTRAVENOUS

## 2017-08-02 MED ORDER — EPHEDRINE SULFATE 50 MG/ML IJ SOLN
INTRAMUSCULAR | Status: DC | PRN
  Administered 2017-08-02 (×2): 5 mg via INTRAVENOUS

## 2017-08-02 MED ORDER — MIDAZOLAM HCL 2 MG/2ML IJ SOLN
INTRAMUSCULAR | Status: AC
  Filled 2017-08-02: qty 2

## 2017-08-02 MED ORDER — PROPOFOL 500 MG/50ML IV EMUL
INTRAVENOUS | Status: DC | PRN
  Administered 2017-08-02: 75 ug/kg/min via INTRAVENOUS

## 2017-08-02 MED ORDER — LACTATED RINGERS IV SOLN
INTRAVENOUS | Status: DC
  Administered 2017-08-02: 07:00:00 via INTRAVENOUS

## 2017-08-02 MED ORDER — METOCLOPRAMIDE HCL 5 MG/ML IJ SOLN: 5.0000 mg | Freq: Three times a day (TID) | INTRAMUSCULAR | Status: DC | PRN

## 2017-08-02 MED ORDER — METOCLOPRAMIDE HCL 5 MG PO TABS: 5.0000 mg | ORAL_TABLET | Freq: Three times a day (TID) | ORAL | Status: DC | PRN

## 2017-08-02 MED ORDER — ONDANSETRON HCL 4 MG PO TABS
4.0000 mg | ORAL_TABLET | Freq: Four times a day (QID) | ORAL | Status: DC | PRN
Start: 2017-08-02 — End: 2017-08-03

## 2017-08-02 MED ORDER — PHENOL 1.4 % MT LIQD: 1.0000 | OROMUCOSAL | Status: DC | PRN

## 2017-08-02 MED ORDER — OXYCODONE HCL 5 MG PO TABS
ORAL_TABLET | ORAL | Status: AC
  Filled 2017-08-02: qty 2

## 2017-08-02 MED ORDER — SORBITOL 70 % SOLN
30.0000 mL | Freq: Every day | Status: DC | PRN
  Filled 2017-08-02: qty 30

## 2017-08-02 MED ORDER — PROPOFOL 1000 MG/100ML IV EMUL
INTRAVENOUS | Status: AC
  Filled 2017-08-02: qty 100

## 2017-08-02 MED ORDER — FENTANYL CITRATE (PF) 100 MCG/2ML IJ SOLN
INTRAMUSCULAR | Status: AC
  Filled 2017-08-02: qty 2

## 2017-08-02 MED ORDER — ACETAMINOPHEN 500 MG PO TABS
1000.0000 mg | ORAL_TABLET | Freq: Four times a day (QID) | ORAL | Status: AC
  Administered 2017-08-02 – 2017-08-03 (×4): 1000 mg via ORAL
  Filled 2017-08-02 (×4): qty 2

## 2017-08-02 MED ORDER — METHOCARBAMOL 500 MG PO TABS: 500.0000 mg | ORAL_TABLET | Freq: Four times a day (QID) | ORAL | 0 refills | Status: DC | PRN

## 2017-08-02 MED ORDER — DOCUSATE SODIUM 100 MG PO CAPS
100.0000 mg | ORAL_CAPSULE | Freq: Two times a day (BID) | ORAL | Status: DC
  Administered 2017-08-02 – 2017-08-03 (×2): 100 mg via ORAL
  Filled 2017-08-02 (×2): qty 1

## 2017-08-02 MED ORDER — ONDANSETRON HCL 4 MG/2ML IJ SOLN: 4.0000 mg | Freq: Once | INTRAMUSCULAR | Status: DC | PRN

## 2017-08-02 MED ORDER — DEXAMETHASONE SODIUM PHOSPHATE 10 MG/ML IJ SOLN
INTRAMUSCULAR | Status: AC
  Filled 2017-08-02: qty 1

## 2017-08-02 MED ORDER — PREGABALIN 75 MG PO CAPS
75.0000 mg | ORAL_CAPSULE | Freq: Once | ORAL | Status: AC
  Administered 2017-08-02: 75 mg via ORAL
  Filled 2017-08-02: qty 1

## 2017-08-02 MED ORDER — PRAZOSIN HCL 2 MG PO CAPS
2.0000 mg | ORAL_CAPSULE | Freq: Every day | ORAL | Status: DC
  Administered 2017-08-02: 2 mg via ORAL
  Filled 2017-08-02: qty 1

## 2017-08-02 MED ORDER — DIAZEPAM 5 MG PO TABS
10.0000 mg | ORAL_TABLET | Freq: Every day | ORAL | Status: DC
  Administered 2017-08-02: 10 mg via ORAL
  Filled 2017-08-02: qty 2

## 2017-08-02 MED ORDER — LACTATED RINGERS IV SOLN
INTRAVENOUS | Status: DC
  Administered 2017-08-02: 12:00:00 via INTRAVENOUS

## 2017-08-02 MED ORDER — DIPHENHYDRAMINE HCL 12.5 MG/5ML PO ELIX: 12.5000 mg | ORAL_SOLUTION | ORAL | Status: DC | PRN

## 2017-08-02 MED ORDER — HYDROMORPHONE HCL 1 MG/ML IJ SOLN
0.5000 mg | INTRAMUSCULAR | Status: DC | PRN
  Administered 2017-08-02 (×2): 1 mg via INTRAVENOUS
  Filled 2017-08-02 (×2): qty 1

## 2017-08-02 MED ORDER — PROPOFOL 10 MG/ML IV BOLUS
INTRAVENOUS | Status: AC
  Filled 2017-08-02: qty 20

## 2017-08-02 MED ORDER — CEFAZOLIN SODIUM-DEXTROSE 1-4 GM/50ML-% IV SOLN
1.0000 g | Freq: Four times a day (QID) | INTRAVENOUS | Status: AC
  Administered 2017-08-02 (×2): 1 g via INTRAVENOUS
  Filled 2017-08-02 (×2): qty 50

## 2017-08-02 MED ORDER — METHOCARBAMOL 500 MG PO TABS
500.0000 mg | ORAL_TABLET | Freq: Four times a day (QID) | ORAL | Status: DC | PRN
  Administered 2017-08-02 – 2017-08-03 (×4): 500 mg via ORAL
  Filled 2017-08-02 (×3): qty 1

## 2017-08-02 MED ORDER — MIDAZOLAM HCL 5 MG/5ML IJ SOLN
INTRAMUSCULAR | Status: DC | PRN
  Administered 2017-08-02: 2 mg via INTRAVENOUS

## 2017-08-02 MED ORDER — CHLORHEXIDINE GLUCONATE 4 % EX LIQD: 60.0000 mL | Freq: Once | CUTANEOUS | Status: DC

## 2017-08-02 MED ORDER — MAGNESIUM CITRATE PO SOLN: 1.0000 | Freq: Once | ORAL | Status: DC | PRN

## 2017-08-02 MED ORDER — ONDANSETRON HCL 4 MG/2ML IJ SOLN: 4.0000 mg | Freq: Four times a day (QID) | INTRAMUSCULAR | Status: DC | PRN

## 2017-08-02 MED ORDER — SODIUM CHLORIDE FLUSH 0.9 % IV SOLN
INTRAVENOUS | Status: DC | PRN
  Administered 2017-08-02: 50 mL

## 2017-08-02 MED ORDER — OXYCODONE HCL 5 MG PO TABS
5.0000 mg | ORAL_TABLET | ORAL | Status: DC | PRN
  Administered 2017-08-02 – 2017-08-03 (×6): 10 mg via ORAL
  Filled 2017-08-02 (×5): qty 2

## 2017-08-02 MED ORDER — ONDANSETRON HCL 4 MG/2ML IJ SOLN
INTRAMUSCULAR | Status: AC
  Filled 2017-08-02: qty 2

## 2017-08-02 MED ORDER — EPHEDRINE SULFATE 50 MG/ML IJ SOLN
INTRAMUSCULAR | Status: AC
  Filled 2017-08-02: qty 1

## 2017-08-02 MED ORDER — ASPIRIN EC 81 MG PO TBEC: 81.0000 mg | DELAYED_RELEASE_TABLET | Freq: Two times a day (BID) | ORAL | 0 refills | Status: DC

## 2017-08-02 MED ORDER — FENTANYL CITRATE (PF) 250 MCG/5ML IJ SOLN
INTRAMUSCULAR | Status: AC
  Filled 2017-08-02: qty 5

## 2017-08-02 MED ORDER — ASPIRIN 81 MG PO CHEW
81.0000 mg | CHEWABLE_TABLET | Freq: Two times a day (BID) | ORAL | Status: DC
  Administered 2017-08-02 – 2017-08-03 (×2): 81 mg via ORAL
  Filled 2017-08-02 (×2): qty 1

## 2017-08-02 MED ORDER — OXYCODONE HCL 5 MG PO TABS: 5.0000 mg | ORAL_TABLET | ORAL | 0 refills | Status: AC | PRN

## 2017-08-02 MED ORDER — ACETAMINOPHEN 500 MG PO TABS
1000.0000 mg | ORAL_TABLET | Freq: Once | ORAL | Status: AC
  Administered 2017-08-02: 1000 mg via ORAL
  Filled 2017-08-02: qty 2

## 2017-08-02 MED ORDER — POVIDONE-IODINE 10 % EX SWAB: 2.0000 "application " | Freq: Once | CUTANEOUS | Status: DC

## 2017-08-02 MED ORDER — LIDOCAINE 2% (20 MG/ML) 5 ML SYRINGE
INTRAMUSCULAR | Status: AC
  Filled 2017-08-02: qty 5

## 2017-08-02 MED ORDER — CEFAZOLIN SODIUM-DEXTROSE 2-4 GM/100ML-% IV SOLN
2.0000 g | INTRAVENOUS | Status: AC
  Administered 2017-08-02: 2 g via INTRAVENOUS
  Filled 2017-08-02: qty 100

## 2017-08-02 MED ORDER — METHOCARBAMOL 500 MG PO TABS
ORAL_TABLET | ORAL | Status: AC
  Filled 2017-08-02: qty 1

## 2017-08-02 MED ORDER — KETOROLAC TROMETHAMINE 15 MG/ML IJ SOLN
15.0000 mg | Freq: Four times a day (QID) | INTRAMUSCULAR | Status: DC
  Administered 2017-08-02 – 2017-08-03 (×4): 15 mg via INTRAVENOUS
  Filled 2017-08-02 (×4): qty 1

## 2017-08-02 MED ORDER — LAMOTRIGINE 100 MG PO TABS
400.0000 mg | ORAL_TABLET | Freq: Every day | ORAL | Status: DC
  Administered 2017-08-02: 400 mg via ORAL
  Filled 2017-08-02: qty 4

## 2017-08-02 MED ORDER — ACETAMINOPHEN 500 MG PO TABS: 1000.0000 mg | ORAL_TABLET | Freq: Three times a day (TID) | ORAL | 0 refills | Status: AC

## 2017-08-02 MED ORDER — LURASIDONE HCL 40 MG PO TABS
80.0000 mg | ORAL_TABLET | Freq: Every day | ORAL | Status: DC
  Administered 2017-08-02: 80 mg via ORAL
  Filled 2017-08-02: qty 2

## 2017-08-02 MED ORDER — KETOROLAC TROMETHAMINE 30 MG/ML IJ SOLN
INTRAMUSCULAR | Status: AC
  Administered 2017-08-02: 15 mg
  Filled 2017-08-02: qty 1

## 2017-08-02 MED ORDER — BUPIVACAINE LIPOSOME 1.3 % IJ SUSP
INTRAMUSCULAR | Status: DC | PRN
  Administered 2017-08-02: 20 mL

## 2017-08-02 MED ORDER — ONDANSETRON HCL 4 MG/2ML IJ SOLN
INTRAMUSCULAR | Status: DC | PRN
  Administered 2017-08-02: 4 mg via INTRAVENOUS

## 2017-08-02 MED ORDER — PHENYLEPHRINE HCL 10 MG/ML IJ SOLN
INTRAVENOUS | Status: DC | PRN
  Administered 2017-08-02: 30 ug/min via INTRAVENOUS

## 2017-08-02 MED ORDER — FENTANYL CITRATE (PF) 100 MCG/2ML IJ SOLN
INTRAMUSCULAR | Status: DC | PRN
  Administered 2017-08-02: 50 ug via INTRAVENOUS

## 2017-08-02 MED ORDER — DOCUSATE SODIUM 100 MG PO CAPS: 100.0000 mg | ORAL_CAPSULE | Freq: Two times a day (BID) | ORAL | 0 refills | Status: DC

## 2017-08-02 MED ORDER — METHOCARBAMOL 1000 MG/10ML IJ SOLN
500.0000 mg | Freq: Four times a day (QID) | INTRAVENOUS | Status: DC | PRN
  Filled 2017-08-02: qty 5

## 2017-08-02 SURGICAL SUPPLY — 55 items
BAG DECANTER FOR FLEXI CONT (MISCELLANEOUS) IMPLANT
BLADE SAG 18X100X1.27 (BLADE) IMPLANT
CLOSURE STERI-STRIP 1/2X4 (GAUZE/BANDAGES/DRESSINGS) ×1
CLSR STERI-STRIP ANTIMIC 1/2X4 (GAUZE/BANDAGES/DRESSINGS) ×2 IMPLANT
CONT SPECI 4OZ STER CLIK (MISCELLANEOUS) ×3 IMPLANT
COVER PERINEAL POST (MISCELLANEOUS) ×3 IMPLANT
COVER SURGICAL LIGHT HANDLE (MISCELLANEOUS) ×3 IMPLANT
DRAPE C-ARM 42X72 X-RAY (DRAPES) ×3 IMPLANT
DRAPE STERI IOBAN 125X83 (DRAPES) ×3 IMPLANT
DRAPE U-SHAPE 47X51 STRL (DRAPES) IMPLANT
DRSG MEPILEX BORDER 4X8 (GAUZE/BANDAGES/DRESSINGS) ×3 IMPLANT
DURAPREP 26ML APPLICATOR (WOUND CARE) ×3 IMPLANT
ELECT BLADE 4.0 EZ CLEAN MEGAD (MISCELLANEOUS) ×3
ELECT REM PT RETURN 9FT ADLT (ELECTROSURGICAL) ×3
ELECTRODE BLDE 4.0 EZ CLN MEGD (MISCELLANEOUS) ×1 IMPLANT
ELECTRODE REM PT RTRN 9FT ADLT (ELECTROSURGICAL) ×1 IMPLANT
FACESHIELD WRAPAROUND (MASK) ×9 IMPLANT
GLOVE BIO SURGEON STRL SZ7.5 (GLOVE) ×6 IMPLANT
GLOVE BIOGEL PI IND STRL 8 (GLOVE) ×2 IMPLANT
GLOVE BIOGEL PI INDICATOR 8 (GLOVE) ×4
GOWN STRL REUS W/ TWL LRG LVL3 (GOWN DISPOSABLE) ×2 IMPLANT
GOWN STRL REUS W/TWL LRG LVL3 (GOWN DISPOSABLE) ×4
HEAD BIOLOX HIP 36/-2.5 (Joint) ×1 IMPLANT
HIP BIOLOX HD 36/-2.5 (Joint) ×3 IMPLANT
INSERT TRIDENT POLY 36MM 0DEG (Insert) ×3 IMPLANT
KIT BASIN OR (CUSTOM PROCEDURE TRAY) ×3 IMPLANT
KIT TURNOVER KIT B (KITS) ×3 IMPLANT
MANIFOLD NEPTUNE II (INSTRUMENTS) ×3 IMPLANT
NDL SAFETY ECLIPSE 18X1.5 (NEEDLE) IMPLANT
NEEDLE HYPO 18GX1.5 SHARP (NEEDLE)
NEEDLE HYPO 22GX1.5 SAFETY (NEEDLE) ×3 IMPLANT
NS IRRIG 1000ML POUR BTL (IV SOLUTION) ×3 IMPLANT
PACK TOTAL JOINT (CUSTOM PROCEDURE TRAY) ×3 IMPLANT
PAD ARMBOARD 7.5X6 YLW CONV (MISCELLANEOUS) ×3 IMPLANT
SCREW HEX LP 6.5X20 (Screw) ×3 IMPLANT
SHELL CLUSTERHOLE ACETABULAR 5 (Shell) ×3 IMPLANT
SPONGE LAP 18X18 X RAY DECT (DISPOSABLE) IMPLANT
STEM HIP 4 127DEG (Stem) ×3 IMPLANT
SUT MNCRL AB 4-0 PS2 18 (SUTURE) ×3 IMPLANT
SUT VIC AB 0 CT1 27 (SUTURE) ×2
SUT VIC AB 0 CT1 27XBRD ANBCTR (SUTURE) ×1 IMPLANT
SUT VIC AB 1 CT1 27 (SUTURE) ×2
SUT VIC AB 1 CT1 27XBRD ANBCTR (SUTURE) ×1 IMPLANT
SUT VIC AB 2-0 CT1 27 (SUTURE) ×2
SUT VIC AB 2-0 CT1 TAPERPNT 27 (SUTURE) ×1 IMPLANT
SUT VLOC 180 0 24IN GS25 (SUTURE) ×3 IMPLANT
SYR 50ML LL SCALE MARK (SYRINGE) ×3 IMPLANT
SYR BULB IRRIGATION 50ML (SYRINGE) ×3 IMPLANT
SYRINGE 20CC LL (MISCELLANEOUS) IMPLANT
TOWEL OR 17X24 6PK STRL BLUE (TOWEL DISPOSABLE) ×3 IMPLANT
TOWEL OR 17X26 10 PK STRL BLUE (TOWEL DISPOSABLE) ×3 IMPLANT
TRAY CATH 16FR W/PLASTIC CATH (SET/KITS/TRAYS/PACK) IMPLANT
TRAY FOLEY MTR SLVR 16FR STAT (SET/KITS/TRAYS/PACK) IMPLANT
WATER STERILE IRR 1000ML POUR (IV SOLUTION) ×3 IMPLANT
YANKAUER SUCT BULB TIP NO VENT (SUCTIONS) ×6 IMPLANT

## 2017-08-02 NOTE — Transfer of Care (Signed)
Immediate Anesthesia Transfer of Care Note  Patient: Floye Fesler  Procedure(s) Performed: RIGHT TOTAL HIP ARTHROPLASTY ANTERIOR APPROACH (Right Hip)  Patient Location: PACU  Anesthesia Type:MAC and Spinal  Level of Consciousness: awake, alert  and oriented  Airway & Oxygen Therapy: Patient Spontanous Breathing and Patient connected to face mask oxygen  Post-op Assessment: Report given to RN and Post -op Vital signs reviewed and stable  Post vital signs: Reviewed and stable  Last Vitals:  Vitals Value Taken Time  BP 109/73 08/02/2017  9:26 AM  Temp    Pulse 69 08/02/2017  9:27 AM  Resp 16 08/02/2017  9:27 AM  SpO2 100 % 08/02/2017  9:27 AM  Vitals shown include unvalidated device data.  Last Pain:  Vitals:   08/02/17 0641  TempSrc:   PainSc: 9       Patients Stated Pain Goal: 4 (74/71/59 5396)  Complications: No apparent anesthesia complications

## 2017-08-02 NOTE — Anesthesia Preprocedure Evaluation (Signed)
Anesthesia Evaluation  Patient identified by MRN, date of birth, ID band Patient awake    Reviewed: Allergy & Precautions, NPO status , Patient's Chart, lab work & pertinent test results  Airway Mallampati: II  TM Distance: >3 FB Neck ROM: Full    Dental  (+) Teeth Intact, Dental Advisory Given   Pulmonary former smoker,    breath sounds clear to auscultation       Cardiovascular  Rhythm:Regular Rate:Normal     Neuro/Psych    GI/Hepatic   Endo/Other    Renal/GU      Musculoskeletal   Abdominal   Peds  Hematology   Anesthesia Other Findings   Reproductive/Obstetrics                             Anesthesia Physical Anesthesia Plan  ASA: III  Anesthesia Plan: General   Post-op Pain Management:    Induction: Intravenous  PONV Risk Score and Plan: Ondansetron and Dexamethasone  Airway Management Planned: Natural Airway and Simple Face Mask  Additional Equipment:   Intra-op Plan:   Post-operative Plan:   Informed Consent: I have reviewed the patients History and Physical, chart, labs and discussed the procedure including the risks, benefits and alternatives for the proposed anesthesia with the patient or authorized representative who has indicated his/her understanding and acceptance.   Dental advisory given  Plan Discussed with: CRNA and Anesthesiologist  Anesthesia Plan Comments:         Anesthesia Quick Evaluation

## 2017-08-02 NOTE — Anesthesia Procedure Notes (Signed)
Spinal  Patient location during procedure: OR Start time: 08/02/2017 7:34 AM End time: 08/02/2017 7:40 AM Staffing Anesthesiologist: Kipp BroodJoslin, Novalyn Lajara, MD Performed: anesthesiologist  Preanesthetic Checklist Completed: patient identified, site marked, surgical consent, pre-op evaluation, timeout performed, IV checked, risks and benefits discussed and monitors and equipment checked Spinal Block Patient position: sitting Prep: ChloraPrep Patient monitoring: heart rate, cardiac monitor, continuous pulse ox and blood pressure Approach: midline Location: L3-4 Injection technique: single-shot Needle Needle type: Pencan  Needle gauge: 24 G Assessment Sensory level: T6 Additional Notes 12 mg 0.75% Bupivacaine injected easily

## 2017-08-02 NOTE — Addendum Note (Signed)
Addendum  created 08/02/17 1549 by Kipp BroodJoslin, Diamante Truszkowski, MD   Child order released for a procedure order, Intraprocedure Blocks edited, Sign clinical note

## 2017-08-02 NOTE — Progress Notes (Signed)
Hilbert OdorSandra Hausen is a 52 y.o. female patient admitted. Awake, alert - oriented  X 4 - no acute distress noted.  VSS - Blood pressure 105/68, pulse 65, temperature 97.8 F (36.6 C), temperature source Oral, resp. rate 14, height 6\' 1"  (1.854 m), weight 77 kg (169 lb 12.8 oz), last menstrual period 07/26/2017, SpO2 97 %.    IV in place, occlusive dsg intact without redness.  Orientation to room, and floor completed.  Admission INP armband ID verified with patient/family, and in place.   SR up x 2, fall assessment complete, with patient and family able to verbalize understanding of risk associated with falls, and verbalized understanding to call nsg before up out of bed.  Call light within reach, patient able to voice, and demonstrate understanding. No evidence of skin break down noted on exam.  Admission nurse notified of admission.     Will cont to eval and treat per MD orders.  Alonza BogusKristie G Tunisia Landgrebe, RN 08/02/2017 12:09 PM

## 2017-08-02 NOTE — Interval H&P Note (Signed)
History and Physical Interval Note:  08/02/2017 7:21 AM  Kayla Howard  has presented today for surgery, with the diagnosis of OA RIGHT HIP  The various methods of treatment have been discussed with the patient and family. After consideration of risks, benefits and other options for treatment, the patient has consented to  Procedure(s): RIGHT TOTAL HIP ARTHROPLASTY ANTERIOR APPROACH (Right) as a surgical intervention .  The patient's history has been reviewed, patient examined, no change in status, stable for surgery.  I have reviewed the patient's chart and labs.  Questions were answered to the patient's satisfaction.     Martavion Couper D

## 2017-08-02 NOTE — Op Note (Signed)
08/02/2017  8:56 AM  PATIENT:  Kayla Howard   MRN: [id number]  PRE-OPERATIVE DIAGNOSIS:  OA RIGHT HIP  POST-OPERATIVE DIAGNOSIS:  OA RIGHT HIP  PROCEDURE:  Procedure(s): RIGHT TOTAL HIP ARTHROPLASTY ANTERIOR APPROACH  PREOPERATIVE INDICATIONS:    Kayla Howard is an 52 y.o. female who has a diagnosis of Primary osteoarthritis of right hip and elected for surgical management after failing conservative treatment.  The risks benefits and alternatives were discussed with the patient including but not limited to the risks of nonoperative treatment, versus surgical intervention including infection, bleeding, nerve injury, periprosthetic fracture, the need for revision surgery, dislocation, leg length discrepancy, blood clots, cardiopulmonary complications, morbidity, mortality, among others, and they were willing to proceed.     OPERATIVE REPORT     SURGEON:   Buddy Loeffelholz, Ernesta Amble, MD    ASSISTANT:  Roxan Hockey, PA-C, he was present and scrubbed throughout the case, critical for completion in a timely fashion, and for retraction, instrumentation, and closure.     ANESTHESIA:  General    COMPLICATIONS:  None.     COMPONENTS:  Stryker acolade fit femur size 4 with a 36 mm -2.5 head ball and a PSL acetabular shell size 52 with a  polyethylene liner    PROCEDURE IN DETAIL:   The patient was met in the holding area and  identified.  The appropriate hip was identified and marked at the operative site.  The patient was then transported to the OR  and  placed under anesthesia per that record.  At that point, the patient was  placed in the supine position and  secured to the operating room table and all bony prominences padded. He received pre-operative antibiotics    The operative lower extremity was prepped from the iliac crest to the distal leg.  Sterile draping was performed.  Time out was performed prior to incision.      Skin incision was made just 2 cm lateral to the ASIS  extending in  line with the tensor fascia lata. Electrocautery was used to control all bleeders. I dissected down sharply to the fascia of the tensor fascia lata was confirmed that the muscle fibers beneath were running posteriorly. I then incised the fascia over the superficial tensor fascia lata in line with the incision. The fascia was elevated off the anterior aspect of the muscle the muscle was retracted posteriorly and protected throughout the case. I then used electrocautery to incise the tensor fascia lata fascia control and all bleeders. Immediately visible was the fat over top of the anterior neck and capsule.  I removed the anterior fat from the capsule and elevated the rectus muscle off of the anterior capsule. I then removed a large time of capsule. The retractors were then placed over the anterior acetabulum as well as around the superior and inferior neck.  I then removed a section of the femoral neck and a napkin ring fashion. Then used the power course to remove the femoral head from the acetabulum and thoroughly irrigated the acetabulum. I sized the femoral head.    I then exposed the deep acetabulum, cleared out any tissue including the ligamentum teres.   After adequate visualization, I excised the labrum, and then sequentially reamed.  I then impacted the acetabular implant into place using fluoroscopy for guidance.  Appropriate version and inclination was confirmed clinically matching their bony anatomy, and with fluoroscopy.  I placed a 20 mm screw in the posterior/superio position with an excellent bite.  I then placed the polyethylene liner in place  I then adducted the leg and released the external rotators from the posterior femur allowing it to be easily delivered up lateral and anterior to the acetabulum for preparation of the femoral canal.    I then prepared the proximal femur using the cookie-cutter and then sequentially reamed and broached.  A trial broach, neck, and head was  utilized, and I reduced the hip and used floroscopy to assess the neck length and femoral implant.  I then impacted the femoral prosthesis into place into the appropriate version. The hip was then reduced and fluoroscopy confirmed appropriate position. Leg lengths were restored.  I then irrigated the hip copiously again with, and repaired the fascia with Vicryl, followed by monocryl for the subcutaneous tissue, Monocryl for the skin, Steri-Strips and sterile gauze. The patient was then awakened and returned to PACU in stable and satisfactory condition. There were no complications.  POST OPERATIVE PLAN: WBAT, DVT px: SCD's/TED, ambulation and chemical dvt px  Kayla Hilbun, MD Orthopedic Surgeon [phone]     

## 2017-08-02 NOTE — Discharge Instructions (Signed)
You may bear weight as tolerated. °Keep your dressing on and dry until follow up. °Take medicine to prevent blood clots as directed. °Take pain medicine as needed with the goal of transitioning to over the counter medicines.  °If needed, you may increase pain medication for the first few days post up to 2 tablets every 4 hours. ° °INSTRUCTIONS AFTER JOINT REPLACEMENT  ° °o Remove items at home which could result in a fall. This includes throw rugs or furniture in walking pathways °o ICE to the affected joint every three hours while awake for 30 minutes at a time, for at least the first 3-5 days, and then as needed for pain and swelling.  Continue to use ice for pain and swelling. You may notice swelling that will progress down to the foot and ankle.  This is normal after surgery.  Elevate your leg when you are not up walking on it.   °o Continue to use the breathing machine you got in the hospital (incentive spirometer) which will help keep your temperature down.  It is common for your temperature to cycle up and down following surgery, especially at night when you are not up moving around and exerting yourself.  The breathing machine keeps your lungs expanded and your temperature down. ° ° °DIET:  As you were doing prior to hospitalization, we recommend a well-balanced diet. ° °DRESSING / WOUND CARE / SHOWERING ° °You may shower 3 days after surgery, but keep the wounds dry during showering.  You may use an occlusive plastic wrap (Press'n Seal for example) with blue painter's tape at edges, NO SOAKING/SUBMERGING IN THE BATHTUB.  If the bandage gets wet, change with a clean dry gauze.  If the incision gets wet, pat the wound dry with a clean towel. ° °ACTIVITY ° °o Increase activity slowly as tolerated, but follow the weight bearing instructions below.   °o No driving for 6 weeks or until further direction given by your physician.  You cannot drive while taking narcotics.  °o No lifting or carrying greater than 10  lbs. until further directed by your surgeon. °o Avoid periods of inactivity such as sitting longer than an hour when not asleep. This helps prevent blood clots.  °o You may return to work once you are authorized by your doctor.  ° ° ° °WEIGHT BEARING  ° °Weight bearing as tolerated with assist device (walker, cane, etc) as directed, use it as long as suggested by your surgeon or therapist, typically at least 4-6 weeks. ° ° °EXERCISES ° °Results after joint replacement surgery are often greatly improved when you follow the exercise, range of motion and muscle strengthening exercises prescribed by your doctor. Safety measures are also important to protect the joint from further injury. Any time any of these exercises cause you to have increased pain or swelling, decrease what you are doing until you are comfortable again and then slowly increase them. If you have problems or questions, call your caregiver or physical therapist for advice.  ° °Rehabilitation is important following a joint replacement. After just a few days of immobilization, the muscles of the leg can become weakened and shrink (atrophy).  These exercises are designed to build up the tone and strength of the thigh and leg muscles and to improve motion. Often times heat used for twenty to thirty minutes before working out will loosen up your tissues and help with improving the range of motion but do not use heat for the first two   weeks following surgery (sometimes heat can increase post-operative swelling).  ° °These exercises can be done on a training (exercise) mat, on the floor, on a table or on a bed. Use whatever works the best and is most comfortable for you.    Use music or television while you are exercising so that the exercises are a pleasant break in your day. This will make your life better with the exercises acting as a break in your routine that you can look forward to.   Perform all exercises about fifteen times, three times per day or as  directed.  You should exercise both the operative leg and the other leg as well. ° °Exercises include: °  °• Quad Sets - Tighten up the muscle on the front of the thigh (Quad) and hold for 5-10 seconds.   °• Straight Leg Raises - With your knee straight (if you were given a brace, keep it on), lift the leg to 60 degrees, hold for 3 seconds, and slowly lower the leg.  Perform this exercise against resistance later as your leg gets stronger.  °• Leg Slides: Lying on your back, slowly slide your foot toward your buttocks, bending your knee up off the floor (only go as far as is comfortable). Then slowly slide your foot back down until your leg is flat on the floor again.  °• Angel Wings: Lying on your back spread your legs to the side as far apart as you can without causing discomfort.  °• Hamstring Strength:  Lying on your back, push your heel against the floor with your leg straight by tightening up the muscles of your buttocks.  Repeat, but this time bend your knee to a comfortable angle, and push your heel against the floor.  You may put a pillow under the heel to make it more comfortable if necessary.  ° °A rehabilitation program following joint replacement surgery can speed recovery and prevent re-injury in the future due to weakened muscles. Contact your doctor or a physical therapist for more information on knee rehabilitation.  ° ° °CONSTIPATION ° °Constipation is defined medically as fewer than three stools per week and severe constipation as less than one stool per week.  Even if you have a regular bowel pattern at home, your normal regimen is likely to be disrupted due to multiple reasons following surgery.  Combination of anesthesia, postoperative narcotics, change in appetite and fluid intake all can affect your bowels.  ° °YOU MUST use at least one of the following options; they are listed in order of increasing strength to get the job done.  They are all available over the counter, and you may need to  use some, POSSIBLY even all of these options:   ° °Drink plenty of fluids (prune juice may be helpful) and high fiber foods °Colace 100 mg by mouth twice a day  °Senokot for constipation as directed and as needed Dulcolax (bisacodyl), take with full glass of water  °Miralax (polyethylene glycol) once or twice a day as needed. ° °If you have tried all these things and are unable to have a bowel movement in the first 3-4 days after surgery call either your surgeon or your primary doctor.   ° °If you experience loose stools or diarrhea, hold the medications until you stool forms back up.  If your symptoms do not get better within 1 week or if they get worse, check with your doctor.  If you experience "the worst abdominal pain ever" or   develop nausea or vomiting, please contact the office immediately for further recommendations for treatment. ° ° °ITCHING:  If you experience itching with your medications, try taking only a single pain pill, or even half a pain pill at a time.  You can also use Benadryl over the counter for itching or also to help with sleep.  ° °TED HOSE STOCKINGS:  Use stockings on both legs until for at least 2 weeks or as directed by physician office. They may be removed at night for sleeping. ° °MEDICATIONS:  See your medication summary on the “After Visit Summary” that nursing will review with you.  You may have some home medications which will be placed on hold until you complete the course of blood thinner medication.  It is important for you to complete the blood thinner medication as prescribed. ° °PRECAUTIONS:  If you experience chest pain or shortness of breath - call 911 immediately for transfer to the hospital emergency department.  ° °If you develop a fever greater that 101 F, purulent drainage from wound, increased redness or drainage from wound, foul odor from the wound/dressing, or calf pain - CONTACT YOUR SURGEON.   °                                                °FOLLOW-UP  APPOINTMENTS:  If you do not already have a post-op appointment, please call the office for an appointment to be seen by your surgeon.  Guidelines for how soon to be seen are listed in your “After Visit Summary”, but are typically between 1-4 weeks after surgery. ° °OTHER INSTRUCTIONS:  ° ° ° °MAKE SURE YOU:  °• Understand these instructions.  °• Get help right away if you are not doing well or get worse.  ° ° °Thank you for letting us be a part of your medical care team.  It is a privilege we respect greatly.  We hope these instructions will help you stay on track for a fast and full recovery!  ° ° ° ° °

## 2017-08-02 NOTE — Evaluation (Signed)
Physical Therapy Evaluation Patient Details Name: Kayla Howard MRN: [id number] DOB: 17-Dec-1965 Today's Date: 08/02/2017   History of Present Illness  Pt is a 52 y.o. female s/p elective R THA (direct anterior approach) on 08/02/17. PMH includes HTN, multiple back surgeries, including fusion and spinal cord stimulator insertion (2018).    Clinical Impression  Pt presents with an overall decrease in functional mobility secondary to above. PTA, pt indep and lives with sister; fiance plans to provide 24/7 support at discharge. Educ on precautions, positioning, therex, and importance of mobility. Today, pt able to transfer and ambulate 150' with RW and min guard for balance, progressing to supervision-level. Overall, pt moving very well. Pt would benefit from continued acute PT services to maximize functional mobility and independence prior to d/c home.     Follow Up Recommendations No PT follow up;Supervision for mobility/OOB    Equipment Recommendations  Rolling walker with 5" wheels    Recommendations for Other Services       Precautions / Restrictions Precautions Precautions: Fall Restrictions Weight Bearing Restrictions: Yes RLE Weight Bearing: Weight bearing as tolerated      Mobility  Bed Mobility Overal bed mobility: Needs Assistance Bed Mobility: Supine to Sit     Supine to sit: Supervision     General bed mobility comments: Increased time and effort; reliant on BUEs to assist RLE to EOB  Transfers Overall transfer level: Needs assistance Equipment used: Rolling walker (2 wheeled) Transfers: Sit to/from Stand Sit to Stand: Min guard         General transfer comment: Cues for hand placement on RW; no physical assist required standing from low bed height  Ambulation/Gait Ambulation/Gait assistance: Min guard;Supervision Gait Distance (Feet): 150 Feet Assistive device: Rolling walker (2 wheeled) Gait Pattern/deviations: Step-through pattern;Decreased stride  length;Decreased weight shift to right Gait velocity: Decreased Gait velocity interpretation: 1.31 - 2.62 ft/sec, indicative of limited community ambulator General Gait Details: Slow, antalgic amb with RW and min guard for balance, progressing to supervision; pt with good ability to utilize heel-to-toe and step-through gait pattern. Able to increase gait speed with cues  Stairs            Wheelchair Mobility    Modified Rankin (Stroke Patients Only)       Balance Overall balance assessment: Needs assistance   Sitting balance-Leahy Scale: Good       Standing balance-Leahy Scale: Fair Standing balance comment: Can static stand with no UE support                             Pertinent Vitals/Pain Pain Assessment: Faces Faces Pain Scale: Hurts little more Pain Location: R hip Pain Descriptors / Indicators: Sore;Grimacing Pain Intervention(s): Monitored during session;RN gave pain meds during session    Home Living Family/patient expects to be discharged to:: Private residence Living Arrangements: Spouse/significant other;Other relatives(Sister, fiance) Available Help at Discharge: Family;Available 24 hours/day Type of Home: House Home Access: Stairs to enter Entrance Stairs-Rails: None Entrance Stairs-Number of Steps: 1 Home Layout: One level Home Equipment: Cane - single point;Bedside commode;Shower seat Additional Comments: Lives with sister. Fiance will be visiting to provide 24/7 support    Prior Function Level of Independence: Independent         Comments: Works as private care attendant (night shift)     Hand Dominance   Dominant Hand: Right    Extremity/Trunk Assessment   Upper Extremity Assessment Upper Extremity Assessment: Overall WFL for  tasks assessed    Lower Extremity Assessment Lower Extremity Assessment: RLE deficits/detail RLE Deficits / Details: s/p R THA; knee flex/ext 3/5, hip flex <3/5 RLE: Unable to fully assess due  to pain    Cervical / Trunk Assessment Cervical / Trunk Assessment: Normal  Communication   Communication: No difficulties  Cognition Arousal/Alertness: Awake/alert Behavior During Therapy: WFL for tasks assessed/performed Overall Cognitive Status: Within Functional Limits for tasks assessed                                        General Comments      Exercises Total Joint Exercises Long Arc Quad: AROM;Right;10 reps;Seated Marching in Standing: AROM;Right;10 reps;Seated   Assessment/Plan    PT Assessment Patient needs continued PT services  PT Problem List Decreased strength;Decreased range of motion;Decreased activity tolerance;Decreased balance;Decreased mobility;Decreased knowledge of use of DME       PT Treatment Interventions DME instruction;Gait training;Stair training;Functional mobility training;Therapeutic activities;Therapeutic exercise;Balance training;Patient/family education    PT Goals (Current goals can be found in the Care Plan section)  Acute Rehab PT Goals Patient Stated Goal: Return home ASAP PT Goal Formulation: With patient Time For Goal Achievement: 08/16/17 Potential to Achieve Goals: Good    Frequency 7X/week   Barriers to discharge        Co-evaluation               AM-PAC PT "6 Clicks" Daily Activity  Outcome Measure Difficulty turning over in bed (including adjusting bedclothes, sheets and blankets)?: None Difficulty moving from lying on back to sitting on the side of the bed? : None Difficulty sitting down on and standing up from a chair with arms (e.g., wheelchair, bedside commode, etc,.)?: A Little Help needed moving to and from a bed to chair (including a wheelchair)?: A Little Help needed walking in hospital room?: A Little Help needed climbing 3-5 steps with a railing? : A Little 6 Click Score: 20    End of Session Equipment Utilized During Treatment: Gait belt Activity Tolerance: Patient tolerated  treatment well Patient left: in chair;with call bell/phone within reach Nurse Communication: Mobility status PT Visit Diagnosis: Other abnormalities of gait and mobility (R26.89);Pain Pain - Right/Left: Right Pain - part of body: Hip    Time: 1610-9604 PT Time Calculation (min) (ACUTE ONLY): 26 min   Charges:   PT Evaluation $PT Eval Low Complexity: 1 Low PT Treatments $Gait Training: 8-22 mins   PT G Codes:       Ina Homes, PT, DPT Acute Rehab Services  Pager: [phone]  Malachy Chamber 08/02/2017, 5:39 PM

## 2017-08-02 NOTE — Anesthesia Postprocedure Evaluation (Signed)
Anesthesia Post Note  Patient: Kayla Howard  Procedure(s) Performed: RIGHT TOTAL HIP ARTHROPLASTY ANTERIOR APPROACH (Right Hip)     Patient location during evaluation: PACU Anesthesia Type: General Level of consciousness: oriented and awake and alert Pain management: pain level controlled Vital Signs Assessment: post-procedure vital signs reviewed and stable Respiratory status: spontaneous breathing, respiratory function stable and patient connected to nasal cannula oxygen Cardiovascular status: blood pressure returned to baseline and stable Postop Assessment: no headache, no backache and no apparent nausea or vomiting Anesthetic complications: no    Last Vitals:  Vitals:   08/02/17 1143 08/02/17 1434  BP: 105/68 91/75  Pulse: 65 66  Resp: 14 14  Temp: 36.6 C 36.6 C  SpO2: 97% 98%    Last Pain:  Vitals:   08/02/17 1434  TempSrc: Oral  PainSc:           RLE Sensation: Numbness (08/02/17 1544)   R Sensory Level: L5-Outer lower leg, top of foot, great toe (08/02/17 1544)  Chantell Kunkler COKER

## 2017-08-02 NOTE — Anesthesia Postprocedure Evaluation (Signed)
Anesthesia Post Note  Patient: Kayla Howard  Procedure(s) Performed: RIGHT TOTAL HIP ARTHROPLASTY ANTERIOR APPROACH (Right Hip)     Patient location during evaluation: PACU Anesthesia Type: General Level of consciousness: oriented and awake and alert Pain management: pain level controlled Vital Signs Assessment: post-procedure vital signs reviewed and stable Respiratory status: spontaneous breathing, respiratory function stable and patient connected to nasal cannula oxygen Cardiovascular status: blood pressure returned to baseline and stable Postop Assessment: no headache, no backache and no apparent nausea or vomiting Anesthetic complications: no    Last Vitals:  Vitals:   08/02/17 1143 08/02/17 1434  BP: 105/68 91/75  Pulse: 65 66  Resp: 14 14  Temp: 36.6 C 36.6 C  SpO2: 97% 98%    Last Pain:  Vitals:   08/02/17 1434  TempSrc: Oral  PainSc:           RLE Sensation: Numbness (08/02/17 1544)   R Sensory Level: L5-Outer lower leg, top of foot, great toe (08/02/17 1544)  Farris Blash COKER     

## 2017-08-03 ENCOUNTER — Encounter (HOSPITAL_COMMUNITY): Payer: Self-pay | Admitting: Orthopedic Surgery

## 2017-08-03 NOTE — Progress Notes (Addendum)
    Subjective: Patient reports pain as mild to moderate, controlled, and less than before surgery.  Tolerating diet.  Urinating.  No CP, SOB.  OOB mobilizing well with therapy and in room.  Objective:   VITALS:   Vitals:   08/02/17 1705 08/02/17 2128 08/03/17 0152 08/03/17 0656  BP: 113/69 94/63 (!) 90/56 97/72  Pulse: 65 67 63 (!) 55  Resp: 18 18 18 18   Temp: 97.9 F (36.6 C) (!) 97.5 F (36.4 C) 98 F (36.7 C) 97.9 F (36.6 C)  TempSrc: Oral Oral Oral Oral  SpO2: 98% 98% 97% 98%  Weight:      Height:       CBC Latest Ref Rng & Units 07/22/2017 06/11/2016  WBC 4.0 - 10.5 K/uL 8.6 5.9  Hemoglobin 12.0 - 15.0 g/dL 46.912.5 11.7(L)  Hematocrit 36.0 - 46.0 % 37.6 35.2(L)  Platelets 150 - 400 K/uL 246 268   BMP Latest Ref Rng & Units 07/22/2017 06/11/2016  Glucose 65 - 99 mg/dL 629(B100(H) 99  BUN 6 - 20 mg/dL 11 5(L)  Creatinine 2.840.44 - 1.00 mg/dL 1.320.74 4.400.70  Sodium 102135 - 145 mmol/L 140 138  Potassium 3.5 - 5.1 mmol/L 3.7 3.8  Chloride 101 - 111 mmol/L 105 104  CO2 22 - 32 mmol/L 26 27  Calcium 8.9 - 10.3 mg/dL 9.1 9.1   Intake/Output      07/02 0701 - 07/03 0700 07/03 0701 - 07/04 0700   P.O. 660    I.V. (mL/kg) 1395 (18.9)    IV Piggyback 50    Total Intake(mL/kg) 2105 (28.5)    Urine (mL/kg/hr) 800 (0.5)    Blood 200    Total Output 1000    Net +1105            Physical Exam: General: NAD.  Upright in bed on arrival.  Calm, conversant. Resp: No increased wob Cardio: regular rate and rhythm ABD soft Neurologically intact MSK RLE: Neurovascularly intact Sensation intact distally Intact pulses distally Dorsiflexion/Plantar flexion intact Incision: dressing C/D/I   Assessment: 1 Day Post-Op  S/P Procedure(s) (LRB): RIGHT TOTAL HIP ARTHROPLASTY ANTERIOR APPROACH (Right) by Dr. Jewel Baizeimothy D. Murphy on 08/02/2017  Principal Problem:   Primary osteoarthritis of right hip Active Problems:   Anxiety   Chronic back pain   PTSD (post-traumatic stress  disorder)   Primary osteoarthritis, status post total hip arthroplasty Doing well postop day 1 Eating, drinking, and voiding. Pain controlled. BP soft overnight but improved/WNL this morning.  Asymptomatic. Mobilizing well with therapies  Plan: Advance diet Up with therapy D/C IV fluids Incentive Spirometry Apply ice   Weight Bearing: Weight Bearing as Tolerated (WBAT) RLE Dressings: Maintain Mepilex.   VTE prophylaxis: Aspirin 81 mg twice daily, SCDs, ambulation Dispo: Home later this morning after therapy.   Lucretia KernHenry Calvin Martensen III, PA-C 08/03/2017, 7:29 AM

## 2017-08-03 NOTE — Progress Notes (Signed)
Physical Therapy Treatment & Discharge Patient Details Name: Kayla Howard MRN: [id number] DOB: 1965-07-21 Today's Date: 08/03/2017    History of Present Illness Pt is a 52 y.o. female s/p elective R THA (direct anterior approach) on 08/02/17. PMH includes HTN, multiple back surgeries, including fusion and spinal cord stimulator insertion (2018).   PT Comments    Pt progressing well with mobility. Performs transfers and ambulation mod indep with RW. Practiced stair training and simulated transfer into/out of truck with supervision and UE support on RW. Pt has met short term acute PT goals. Will have 24/7 assist available from fiance upon return home. D/c acute PT.   Follow Up Recommendations  No PT follow up;Supervision for mobility/OOB     Equipment Recommendations  Rolling walker with 5" wheels    Recommendations for Other Services       Precautions / Restrictions Precautions Precautions: Fall Restrictions Weight Bearing Restrictions: Yes RLE Weight Bearing: Weight bearing as tolerated    Mobility  Bed Mobility               General bed mobility comments: Received sitting in recliner  Transfers Overall transfer level: Modified independent Equipment used: Rolling walker (2 wheeled) Transfers: Sit to/from Stand              Ambulation/Gait Ambulation/Gait assistance: Modified independent (Device/Increase time) Gait Distance (Feet): 600 Feet Assistive device: Rolling walker (2 wheeled) Gait Pattern/deviations: Step-through pattern;Decreased stride length Gait velocity: Decreased Gait velocity interpretation: 1.31 - 2.62 ft/sec, indicative of limited community ambulator     Stairs Stairs: Yes Stairs assistance: Supervision Stair Management: No rails;Step to pattern;Backwards;Forwards;With walker Number of Stairs: 1 General stair comments: Ascend/descended 1 platform step with RW; supervision for technique   Wheelchair Mobility    Modified Rankin  (Stroke Patients Only)       Balance Overall balance assessment: Needs assistance   Sitting balance-Leahy Scale: Good       Standing balance-Leahy Scale: Fair Standing balance comment: Can static stand with no UE support                            Cognition Arousal/Alertness: Awake/alert Behavior During Therapy: WFL for tasks assessed/performed Overall Cognitive Status: Within Functional Limits for tasks assessed                                        Exercises      General Comments        Pertinent Vitals/Pain Pain Assessment: 0-10 Pain Score: 6  Pain Location: R hip Pain Descriptors / Indicators: Sore Pain Intervention(s): Monitored during session    Home Living                      Prior Function            PT Goals (current goals can now be found in the care plan section) Acute Rehab PT Goals PT Goal Formulation: All assessment and education complete, DC therapy Time For Goal Achievement: 08/16/17 Potential to Achieve Goals: Good Progress towards PT goals: Progressing toward goals    Frequency    7X/week      PT Plan Current plan remains appropriate    Co-evaluation              AM-PAC PT "6 Clicks" Daily Activity  Outcome Measure  Difficulty  turning over in bed (including adjusting bedclothes, sheets and blankets)?: None Difficulty moving from lying on back to sitting on the side of the bed? : None Difficulty sitting down on and standing up from a chair with arms (e.g., wheelchair, bedside commode, etc,.)?: None Help needed moving to and from a bed to chair (including a wheelchair)?: None Help needed walking in hospital room?: None Help needed climbing 3-5 steps with a railing? : A Little 6 Click Score: 23    End of Session Equipment Utilized During Treatment: Gait belt Activity Tolerance: Patient tolerated treatment well Patient left: in chair;with call bell/phone within reach Nurse  Communication: Mobility status PT Visit Diagnosis: Other abnormalities of gait and mobility (R26.89);Pain Pain - Right/Left: Right Pain - part of body: Hip     Time: 9518-8416 PT Time Calculation (min) (ACUTE ONLY): 16 min  Charges:  $Gait Training: 8-22 mins                    G Codes:      Mabeline Caras, PT, DPT Acute Rehab Services  Pager: [place] 08/03/2017, 10:34 AM

## 2017-08-03 NOTE — Progress Notes (Signed)
Kayla OdorSandra Howard to be D/C'd  per MD order. Discussed with the patient and all questions fully answered.  VSS, Skin clean, dry and intact without evidence of skin break down, no evidence of skin tears noted.  IV catheter discontinued intact. Site without signs and symptoms of complications. Dressing and pressure applied.  An After Visit Summary was printed and given to the patient. Patient received prescription.  D/c education completed with patient/family including follow up instructions, medication list, d/c activities limitations if indicated, with other d/c instructions as indicated by MD - patient able to verbalize understanding, all questions fully answered.   Patient instructed to return to ED, call 911, or call MD for any changes in condition.   Patient to be escorted via WC, and D/C home via private auto.

## 2017-08-03 NOTE — Progress Notes (Signed)
OT Cancellation Note  Patient Details Name: Kayla Howard MRN: [id number] DOB: 08-Jan-1966   Cancelled Treatment:    Reason Eval/Treat Not Completed: OT screened, no needs identified, will sign off. Pt ambulating with RW with overall supervision and able to don/doff LB clothing with supervision for balance. Pain is being managed well and pt with no further concerns at this time. Thank you for the referral.   Alen BleacherBradsher, Chiron Campione P, MS, OTR/L 08/03/2017, 9:05 AM

## 2017-08-03 NOTE — Discharge Summary (Signed)
Discharge Summary  Patient ID: Kayla Howard MRN: [id number] DOB/AGE: 03/24/1965 52 y.o.  Admit date: 08/02/2017 Discharge date: 08/03/2017  Admission Diagnoses:  Primary osteoarthritis of right hip  Discharge Diagnoses:  Principal Problem:   Primary osteoarthritis of right hip Active Problems:   Anxiety   Chronic back pain   PTSD (post-traumatic stress disorder)   Past Medical History:  Diagnosis Date  . Anxiety    takes Valium nightly  . Chronic back pain    several back surgeries  . Complication of anesthesia    gets very anxious after anesthesia  . Depression   . History of bronchitis 2005  . History of migraine    last one 6 months ago  . Insomnia    takes Trazodone nightly  . Pneumonia    hx of 2005  . PTSD (post-traumatic stress disorder)    takes Lamictal,Minipress,and Latuda nightly    Surgeries: Procedure(s): RIGHT TOTAL HIP ARTHROPLASTY ANTERIOR APPROACH on 08/02/2017   Consultants (if any):   Discharged Condition: Improved  Hospital Course: Kayla Howard is an 52 y.o. female who was admitted 08/02/2017 with a diagnosis of Primary osteoarthritis of right hip and went to the operating room on 08/02/2017 and underwent the above named procedures.    She was given perioperative antibiotics:  Anti-infectives (From admission, onward)   Start     Dose/Rate Route Frequency Ordered Stop   08/02/17 1400  ceFAZolin (ANCEF) IVPB 1 g/50 mL premix     1 g 100 mL/hr over 30 Minutes Intravenous Every 6 hours 08/02/17 1134 08/02/17 2128   08/02/17 0630  ceFAZolin (ANCEF) IVPB 2g/100 mL premix     2 g 200 mL/hr over 30 Minutes Intravenous On call to O.R. 08/02/17 4098 08/02/17 0825    .  She was given sequential compression devices, early ambulation, and aspirin for DVT prophylaxis.  She benefited maximally from the hospital stay and there were no complications.    Recent vital signs:  Vitals:   08/03/17 0152 08/03/17 0656  BP: (!) 90/56 97/72  Pulse: 63 (!) 55   Resp: 18 18  Temp: 98 F (36.7 C) 97.9 F (36.6 C)  SpO2: 97% 98%    Recent laboratory studies:  Lab Results  Component Value Date   HGB 12.5 07/22/2017   HGB 11.7 (L) 06/11/2016   Lab Results  Component Value Date   WBC 8.6 07/22/2017   PLT 246 07/22/2017   No results found for: INR Lab Results  Component Value Date   NA 140 07/22/2017   K 3.7 07/22/2017   CL 105 07/22/2017   CO2 26 07/22/2017   BUN 11 07/22/2017   CREATININE 0.74 07/22/2017   GLUCOSE 100 (H) 07/22/2017    Discharge Medications:   Allergies as of 08/03/2017      Reactions   Ambien [zolpidem Tartrate] Other (See Comments)   Disorientation/anxiety   Morphine And Related Nausea And Vomiting, Other (See Comments)   Migraines/clammy   Flexeril [cyclobenzaprine] Itching   Severe itching      Medication List    STOP taking these medications   acetaminophen 650 MG CR tablet Commonly known as:  TYLENOL Replaced by:  acetaminophen 500 MG tablet     TAKE these medications   acetaminophen 500 MG tablet Commonly known as:  TYLENOL Take 2 tablets (1,000 mg total) by mouth every 8 (eight) hours for 14 days. For Pain. Replaces:  acetaminophen 650 MG CR tablet   aspirin EC 81 MG tablet Take 1  tablet (81 mg total) by mouth 2 (two) times daily. For DVT prophylaxis   BIOTIN PO Take 1 tablet by mouth at bedtime.   COLLAGEN PO Take 1 capsule by mouth at bedtime.   diazepam 10 MG tablet Commonly known as:  VALIUM Take 10 mg by mouth at bedtime.   docusate sodium 100 MG capsule Commonly known as:  COLACE Take 1 capsule (100 mg total) by mouth 2 (two) times daily. To prevent constipation while taking pain medication.   ibuprofen 800 MG tablet Commonly known as:  ADVIL,MOTRIN Take 800 mg by mouth 3 (three) times daily.   lamoTRIgine 200 MG tablet Commonly known as:  LAMICTAL Take 400 mg by mouth at bedtime.   LATUDA 80 MG Tabs tablet Generic drug:  lurasidone Take 80 mg by mouth at bedtime.    methocarbamol 500 MG tablet Commonly known as:  ROBAXIN Take 1 tablet (500 mg total) by mouth every 6 (six) hours as needed for muscle spasms.   ondansetron 4 MG tablet Commonly known as:  ZOFRAN Take 1 tablet (4 mg total) by mouth every 8 (eight) hours as needed for nausea or vomiting.   oxyCODONE 5 MG immediate release tablet Commonly known as:  ROXICODONE Take 1 tablet (5 mg total) by mouth every 4 (four) hours as needed for up to 7 days for breakthrough pain.   prazosin 2 MG capsule Commonly known as:  MINIPRESS Take 2 mg by mouth at bedtime.   traZODone 150 MG tablet Commonly known as:  DESYREL Take 300 mg by mouth at bedtime.       Diagnostic Studies: Dg C-arm 1-60 Min  Result Date: 08/02/2017 CLINICAL DATA:  Status post anterior approach right total hip joint prosthesis placement. Reported fluoro time is 16 seconds EXAM: DG C-ARM 61-120 MIN; OPERATIVE RIGHT HIP WITH PELVIS COMPARISON:  Right hip series of March 04, 2017 FINDINGS: Two AP fluoro spot images reveal placement of a total right hip joint prosthesis. Radiographic positioning of the prosthetic components is good. The interface with the native bone appears normal. IMPRESSION: No immediate postprocedure complication following right total hip joint prosthesis placement. Electronically Signed   By: David  [place] M.D.   On: 08/02/2017 09:15   Dg Hip Operative Unilat W Or W/o Pelvis Right  Result Date: 08/02/2017 CLINICAL DATA:  Status post anterior approach right total hip joint prosthesis placement. Reported fluoro time is 16 seconds EXAM: DG C-ARM 61-120 MIN; OPERATIVE RIGHT HIP WITH PELVIS COMPARISON:  Right hip series of March 04, 2017 FINDINGS: Two AP fluoro spot images reveal placement of a total right hip joint prosthesis. Radiographic positioning of the prosthetic components is good. The interface with the native bone appears normal. IMPRESSION: No immediate postprocedure complication following right total hip  joint prosthesis placement. Electronically Signed   By: David  [place] M.D.   On: 08/02/2017 09:15    Disposition: Discharge disposition: 01-Home or Self Care  home in care of her sister and fiance.     Discharge Instructions    Discharge patient   Complete by:  As directed    After A.M. Therapy session.   Discharge disposition:  01-Home or Self Care   Discharge patient date:  08/03/2017      Follow-up Information    Sheral Apley, MD Follow up.   Specialty:  Orthopedic Surgery Contact information: [address] [place] [address] [phone]            Signed: Albina Billet  III PA-C 08/03/2017, 7:35 AM

## 2017-08-03 NOTE — Care Management Note (Signed)
Case Management Note  Patient Details  Name: Kayla Howard MRN: [id number] Date of Birth: 12-Sep-1965  Subjective/Objective:                    Action/Plan:  Patient has 3 in 1 at home. Walker ordered Expected Discharge Date:  08/03/17               Expected Discharge Plan:  Home w Home Health Services  In-House Referral:     Discharge planning Services  CM Consult  Post Acute Care Choice:  Durable Medical Equipment, Home Health Choice offered to:  Patient  DME Arranged:  Walker rolling DME Agency:  [facility]  HH Arranged:  PT HH Agency:  [facility]  Status of Service:  Completed, signed off  If discussed at [employer] Length of Stay Meetings, dates discussed:    Additional Comments:  Kingsley PlanWile, Aditya Nastasi Marie, RN 08/03/2017, 8:27 AM

## 2017-08-04 DIAGNOSIS — F4310 Post-traumatic stress disorder, unspecified: Secondary | ICD-10-CM | POA: Diagnosis not present

## 2017-08-04 DIAGNOSIS — M549 Dorsalgia, unspecified: Secondary | ICD-10-CM | POA: Diagnosis not present

## 2017-08-04 DIAGNOSIS — Z471 Aftercare following joint replacement surgery: Secondary | ICD-10-CM | POA: Diagnosis not present

## 2017-08-04 DIAGNOSIS — I10 Essential (primary) hypertension: Secondary | ICD-10-CM | POA: Diagnosis not present

## 2017-08-04 DIAGNOSIS — G8929 Other chronic pain: Secondary | ICD-10-CM | POA: Diagnosis not present

## 2017-08-04 DIAGNOSIS — G43909 Migraine, unspecified, not intractable, without status migrainosus: Secondary | ICD-10-CM | POA: Diagnosis not present

## 2017-08-05 DIAGNOSIS — Z471 Aftercare following joint replacement surgery: Secondary | ICD-10-CM | POA: Diagnosis not present

## 2017-08-05 DIAGNOSIS — M549 Dorsalgia, unspecified: Secondary | ICD-10-CM | POA: Diagnosis not present

## 2017-08-05 DIAGNOSIS — I10 Essential (primary) hypertension: Secondary | ICD-10-CM | POA: Diagnosis not present

## 2017-08-05 DIAGNOSIS — F4310 Post-traumatic stress disorder, unspecified: Secondary | ICD-10-CM | POA: Diagnosis not present

## 2017-08-05 DIAGNOSIS — G43909 Migraine, unspecified, not intractable, without status migrainosus: Secondary | ICD-10-CM | POA: Diagnosis not present

## 2017-08-05 DIAGNOSIS — G8929 Other chronic pain: Secondary | ICD-10-CM | POA: Diagnosis not present

## 2017-08-08 DIAGNOSIS — Z471 Aftercare following joint replacement surgery: Secondary | ICD-10-CM | POA: Diagnosis not present

## 2017-08-08 DIAGNOSIS — G43909 Migraine, unspecified, not intractable, without status migrainosus: Secondary | ICD-10-CM | POA: Diagnosis not present

## 2017-08-08 DIAGNOSIS — M549 Dorsalgia, unspecified: Secondary | ICD-10-CM | POA: Diagnosis not present

## 2017-08-08 DIAGNOSIS — G8929 Other chronic pain: Secondary | ICD-10-CM | POA: Diagnosis not present

## 2017-08-08 DIAGNOSIS — F4310 Post-traumatic stress disorder, unspecified: Secondary | ICD-10-CM | POA: Diagnosis not present

## 2017-08-08 DIAGNOSIS — I10 Essential (primary) hypertension: Secondary | ICD-10-CM | POA: Diagnosis not present

## 2017-08-10 DIAGNOSIS — I10 Essential (primary) hypertension: Secondary | ICD-10-CM | POA: Diagnosis not present

## 2017-08-10 DIAGNOSIS — G8929 Other chronic pain: Secondary | ICD-10-CM | POA: Diagnosis not present

## 2017-08-10 DIAGNOSIS — G43909 Migraine, unspecified, not intractable, without status migrainosus: Secondary | ICD-10-CM | POA: Diagnosis not present

## 2017-08-10 DIAGNOSIS — Z471 Aftercare following joint replacement surgery: Secondary | ICD-10-CM | POA: Diagnosis not present

## 2017-08-10 DIAGNOSIS — F4310 Post-traumatic stress disorder, unspecified: Secondary | ICD-10-CM | POA: Diagnosis not present

## 2017-08-10 DIAGNOSIS — M549 Dorsalgia, unspecified: Secondary | ICD-10-CM | POA: Diagnosis not present

## 2017-08-12 DIAGNOSIS — F4310 Post-traumatic stress disorder, unspecified: Secondary | ICD-10-CM | POA: Diagnosis not present

## 2017-08-12 DIAGNOSIS — M549 Dorsalgia, unspecified: Secondary | ICD-10-CM | POA: Diagnosis not present

## 2017-08-12 DIAGNOSIS — G8929 Other chronic pain: Secondary | ICD-10-CM | POA: Diagnosis not present

## 2017-08-12 DIAGNOSIS — I10 Essential (primary) hypertension: Secondary | ICD-10-CM | POA: Diagnosis not present

## 2017-08-12 DIAGNOSIS — G43909 Migraine, unspecified, not intractable, without status migrainosus: Secondary | ICD-10-CM | POA: Diagnosis not present

## 2017-08-12 DIAGNOSIS — Z471 Aftercare following joint replacement surgery: Secondary | ICD-10-CM | POA: Diagnosis not present

## 2017-08-17 DIAGNOSIS — M1611 Unilateral primary osteoarthritis, right hip: Secondary | ICD-10-CM | POA: Diagnosis not present

## 2017-08-18 DIAGNOSIS — Z96641 Presence of right artificial hip joint: Secondary | ICD-10-CM | POA: Diagnosis not present

## 2017-08-18 DIAGNOSIS — M25551 Pain in right hip: Secondary | ICD-10-CM | POA: Diagnosis not present

## 2017-08-18 DIAGNOSIS — M25651 Stiffness of right hip, not elsewhere classified: Secondary | ICD-10-CM | POA: Diagnosis not present

## 2017-08-22 DIAGNOSIS — M25551 Pain in right hip: Secondary | ICD-10-CM | POA: Diagnosis not present

## 2017-08-22 DIAGNOSIS — Z96641 Presence of right artificial hip joint: Secondary | ICD-10-CM | POA: Diagnosis not present

## 2017-08-22 DIAGNOSIS — M25651 Stiffness of right hip, not elsewhere classified: Secondary | ICD-10-CM | POA: Diagnosis not present

## 2017-08-24 DIAGNOSIS — Z96641 Presence of right artificial hip joint: Secondary | ICD-10-CM | POA: Diagnosis not present

## 2017-08-24 DIAGNOSIS — M25651 Stiffness of right hip, not elsewhere classified: Secondary | ICD-10-CM | POA: Diagnosis not present

## 2017-08-24 DIAGNOSIS — M25551 Pain in right hip: Secondary | ICD-10-CM | POA: Diagnosis not present

## 2017-08-25 DIAGNOSIS — F4310 Post-traumatic stress disorder, unspecified: Secondary | ICD-10-CM | POA: Diagnosis not present

## 2017-08-25 DIAGNOSIS — F411 Generalized anxiety disorder: Secondary | ICD-10-CM | POA: Diagnosis not present

## 2017-08-25 DIAGNOSIS — F321 Major depressive disorder, single episode, moderate: Secondary | ICD-10-CM | POA: Diagnosis not present

## 2017-08-29 DIAGNOSIS — Z96641 Presence of right artificial hip joint: Secondary | ICD-10-CM | POA: Diagnosis not present

## 2017-08-29 DIAGNOSIS — M25651 Stiffness of right hip, not elsewhere classified: Secondary | ICD-10-CM | POA: Diagnosis not present

## 2017-08-29 DIAGNOSIS — M25551 Pain in right hip: Secondary | ICD-10-CM | POA: Diagnosis not present

## 2017-09-01 DIAGNOSIS — M25551 Pain in right hip: Secondary | ICD-10-CM | POA: Diagnosis not present

## 2017-09-01 DIAGNOSIS — M25651 Stiffness of right hip, not elsewhere classified: Secondary | ICD-10-CM | POA: Diagnosis not present

## 2017-09-01 DIAGNOSIS — Z96641 Presence of right artificial hip joint: Secondary | ICD-10-CM | POA: Diagnosis not present

## 2017-09-06 DIAGNOSIS — M25551 Pain in right hip: Secondary | ICD-10-CM | POA: Diagnosis not present

## 2017-09-06 DIAGNOSIS — M25651 Stiffness of right hip, not elsewhere classified: Secondary | ICD-10-CM | POA: Diagnosis not present

## 2017-09-06 DIAGNOSIS — Z96641 Presence of right artificial hip joint: Secondary | ICD-10-CM | POA: Diagnosis not present

## 2017-09-08 DIAGNOSIS — Z96641 Presence of right artificial hip joint: Secondary | ICD-10-CM | POA: Diagnosis not present

## 2017-09-08 DIAGNOSIS — M25651 Stiffness of right hip, not elsewhere classified: Secondary | ICD-10-CM | POA: Diagnosis not present

## 2017-09-08 DIAGNOSIS — M25551 Pain in right hip: Secondary | ICD-10-CM | POA: Diagnosis not present

## 2017-09-13 DIAGNOSIS — M25651 Stiffness of right hip, not elsewhere classified: Secondary | ICD-10-CM | POA: Diagnosis not present

## 2017-09-13 DIAGNOSIS — Z96641 Presence of right artificial hip joint: Secondary | ICD-10-CM | POA: Diagnosis not present

## 2017-09-13 DIAGNOSIS — M25551 Pain in right hip: Secondary | ICD-10-CM | POA: Diagnosis not present

## 2017-09-14 DIAGNOSIS — M1611 Unilateral primary osteoarthritis, right hip: Secondary | ICD-10-CM | POA: Diagnosis not present

## 2017-09-15 DIAGNOSIS — Z96641 Presence of right artificial hip joint: Secondary | ICD-10-CM | POA: Diagnosis not present

## 2017-09-15 DIAGNOSIS — M25551 Pain in right hip: Secondary | ICD-10-CM | POA: Diagnosis not present

## 2017-09-15 DIAGNOSIS — M25651 Stiffness of right hip, not elsewhere classified: Secondary | ICD-10-CM | POA: Diagnosis not present

## 2017-09-27 DIAGNOSIS — Z6822 Body mass index (BMI) 22.0-22.9, adult: Secondary | ICD-10-CM | POA: Diagnosis not present

## 2017-09-27 DIAGNOSIS — F419 Anxiety disorder, unspecified: Secondary | ICD-10-CM | POA: Diagnosis not present

## 2017-09-27 DIAGNOSIS — Z299 Encounter for prophylactic measures, unspecified: Secondary | ICD-10-CM | POA: Diagnosis not present

## 2017-09-27 DIAGNOSIS — Z7189 Other specified counseling: Secondary | ICD-10-CM | POA: Diagnosis not present

## 2017-09-27 DIAGNOSIS — R5383 Other fatigue: Secondary | ICD-10-CM | POA: Diagnosis not present

## 2017-09-27 DIAGNOSIS — Z1211 Encounter for screening for malignant neoplasm of colon: Secondary | ICD-10-CM | POA: Diagnosis not present

## 2017-09-27 DIAGNOSIS — Z1339 Encounter for screening examination for other mental health and behavioral disorders: Secondary | ICD-10-CM | POA: Diagnosis not present

## 2017-09-27 DIAGNOSIS — Z0000 Encounter for general adult medical examination without abnormal findings: Secondary | ICD-10-CM | POA: Diagnosis not present

## 2017-09-27 DIAGNOSIS — F39 Unspecified mood [affective] disorder: Secondary | ICD-10-CM | POA: Diagnosis not present

## 2017-09-27 DIAGNOSIS — Z1331 Encounter for screening for depression: Secondary | ICD-10-CM | POA: Diagnosis not present

## 2017-10-05 DIAGNOSIS — F419 Anxiety disorder, unspecified: Secondary | ICD-10-CM | POA: Diagnosis not present

## 2017-10-05 DIAGNOSIS — R5383 Other fatigue: Secondary | ICD-10-CM | POA: Diagnosis not present

## 2017-10-05 DIAGNOSIS — Z79899 Other long term (current) drug therapy: Secondary | ICD-10-CM | POA: Diagnosis not present

## 2017-10-07 DIAGNOSIS — F419 Anxiety disorder, unspecified: Secondary | ICD-10-CM | POA: Diagnosis not present

## 2017-10-07 DIAGNOSIS — Z6822 Body mass index (BMI) 22.0-22.9, adult: Secondary | ICD-10-CM | POA: Diagnosis not present

## 2017-10-07 DIAGNOSIS — Z299 Encounter for prophylactic measures, unspecified: Secondary | ICD-10-CM | POA: Diagnosis not present

## 2017-10-07 DIAGNOSIS — Z713 Dietary counseling and surveillance: Secondary | ICD-10-CM | POA: Diagnosis not present

## 2017-10-07 DIAGNOSIS — F39 Unspecified mood [affective] disorder: Secondary | ICD-10-CM | POA: Diagnosis not present

## 2017-11-21 DIAGNOSIS — F4310 Post-traumatic stress disorder, unspecified: Secondary | ICD-10-CM | POA: Diagnosis not present

## 2017-11-21 DIAGNOSIS — F411 Generalized anxiety disorder: Secondary | ICD-10-CM | POA: Diagnosis not present

## 2017-11-21 DIAGNOSIS — F321 Major depressive disorder, single episode, moderate: Secondary | ICD-10-CM | POA: Diagnosis not present

## 2018-02-16 DIAGNOSIS — F321 Major depressive disorder, single episode, moderate: Secondary | ICD-10-CM | POA: Diagnosis not present

## 2018-02-16 DIAGNOSIS — F411 Generalized anxiety disorder: Secondary | ICD-10-CM | POA: Diagnosis not present

## 2018-02-16 DIAGNOSIS — F4310 Post-traumatic stress disorder, unspecified: Secondary | ICD-10-CM | POA: Diagnosis not present

## 2018-05-04 DIAGNOSIS — Z299 Encounter for prophylactic measures, unspecified: Secondary | ICD-10-CM | POA: Diagnosis not present

## 2018-05-04 DIAGNOSIS — F419 Anxiety disorder, unspecified: Secondary | ICD-10-CM | POA: Diagnosis not present

## 2018-05-04 DIAGNOSIS — H6692 Otitis media, unspecified, left ear: Secondary | ICD-10-CM | POA: Diagnosis not present

## 2018-05-04 DIAGNOSIS — F39 Unspecified mood [affective] disorder: Secondary | ICD-10-CM | POA: Diagnosis not present

## 2018-05-04 DIAGNOSIS — Z6822 Body mass index (BMI) 22.0-22.9, adult: Secondary | ICD-10-CM | POA: Diagnosis not present

## 2018-05-16 DIAGNOSIS — F4310 Post-traumatic stress disorder, unspecified: Secondary | ICD-10-CM | POA: Diagnosis not present

## 2018-05-16 DIAGNOSIS — F411 Generalized anxiety disorder: Secondary | ICD-10-CM | POA: Diagnosis not present

## 2018-05-16 DIAGNOSIS — F321 Major depressive disorder, single episode, moderate: Secondary | ICD-10-CM | POA: Diagnosis not present

## 2018-06-22 DIAGNOSIS — F4310 Post-traumatic stress disorder, unspecified: Secondary | ICD-10-CM | POA: Diagnosis not present

## 2018-06-22 DIAGNOSIS — R05 Cough: Secondary | ICD-10-CM | POA: Diagnosis not present

## 2018-06-22 DIAGNOSIS — M25519 Pain in unspecified shoulder: Secondary | ICD-10-CM | POA: Diagnosis not present

## 2018-06-22 DIAGNOSIS — F419 Anxiety disorder, unspecified: Secondary | ICD-10-CM | POA: Diagnosis not present

## 2018-06-22 DIAGNOSIS — Z6822 Body mass index (BMI) 22.0-22.9, adult: Secondary | ICD-10-CM | POA: Diagnosis not present

## 2018-06-22 DIAGNOSIS — Z299 Encounter for prophylactic measures, unspecified: Secondary | ICD-10-CM | POA: Diagnosis not present

## 2018-06-22 DIAGNOSIS — M546 Pain in thoracic spine: Secondary | ICD-10-CM | POA: Diagnosis not present

## 2018-06-22 DIAGNOSIS — S24109A Unspecified injury at unspecified level of thoracic spinal cord, initial encounter: Secondary | ICD-10-CM | POA: Diagnosis not present

## 2018-07-12 DIAGNOSIS — M1611 Unilateral primary osteoarthritis, right hip: Secondary | ICD-10-CM | POA: Diagnosis not present

## 2018-07-13 DIAGNOSIS — M1612 Unilateral primary osteoarthritis, left hip: Secondary | ICD-10-CM | POA: Diagnosis not present

## 2018-07-13 DIAGNOSIS — F4310 Post-traumatic stress disorder, unspecified: Secondary | ICD-10-CM | POA: Diagnosis not present

## 2018-07-13 DIAGNOSIS — Z6823 Body mass index (BMI) 23.0-23.9, adult: Secondary | ICD-10-CM | POA: Diagnosis not present

## 2018-07-13 DIAGNOSIS — G4700 Insomnia, unspecified: Secondary | ICD-10-CM | POA: Diagnosis not present

## 2018-07-13 DIAGNOSIS — F39 Unspecified mood [affective] disorder: Secondary | ICD-10-CM | POA: Diagnosis not present

## 2018-07-13 DIAGNOSIS — Z299 Encounter for prophylactic measures, unspecified: Secondary | ICD-10-CM | POA: Diagnosis not present

## 2018-07-14 DIAGNOSIS — F411 Generalized anxiety disorder: Secondary | ICD-10-CM | POA: Diagnosis not present

## 2018-07-14 DIAGNOSIS — F321 Major depressive disorder, single episode, moderate: Secondary | ICD-10-CM | POA: Diagnosis not present

## 2018-07-14 DIAGNOSIS — F4310 Post-traumatic stress disorder, unspecified: Secondary | ICD-10-CM | POA: Diagnosis not present

## 2018-07-26 DIAGNOSIS — M25552 Pain in left hip: Secondary | ICD-10-CM | POA: Diagnosis not present

## 2018-08-08 DIAGNOSIS — M1612 Unilateral primary osteoarthritis, left hip: Secondary | ICD-10-CM | POA: Diagnosis present

## 2018-08-08 NOTE — H&P (Signed)
HIP ARTHROPLASTY ADMISSION H&P  Patient ID: Kayla Howard MRN: [id number] DOB/AGE: 1965/04/09 53 y.o.  Chief Complaint: left hip pain.  Planned Procedure Date: 08/29/2018 Medical and cardiac clearance by Dr. Woody Seller    HPI: Kayla Howard is a 53 y.o. female with a history of anxiety, PTSD, and chronic back pain status post lumbar surgery and spinal cord stimulator placement who presents for evaluation of OA LEFT HIP. The patient has a history of pain and functional disability in the left hip due to arthritis and has failed non-surgical conservative treatments for greater than 12 weeks to include NSAID's and/or analgesics, corticosteriod injections and activity modification.  Onset of symptoms was gradual, starting 1 years ago with gradually worsening course since that time.  Patient currently rates pain at 9 out of 10 with activity. Patient has night pain, worsening of pain with activity and weight bearing and pain that interferes with activities of daily living.  Patient has evidence of cam deformity, periarticular osteophytes and joint space narrowing by imaging studies.  There is no active infection.  Past Medical History:  Diagnosis Date  . Anxiety    takes Valium nightly  . Chronic back pain    several back surgeries  . Complication of anesthesia    gets very anxious after anesthesia  . Depression   . History of bronchitis 2005  . History of migraine    last one 6 months ago  . Insomnia    takes Trazodone nightly  . Pneumonia    hx of 2005  . PTSD (post-traumatic stress disorder)    takes Lamictal,Minipress,and Latuda nightly   Past Surgical History:  Procedure Laterality Date  . BACK SURGERY     x2  . battery replacement to spinal cord stimulator     . epidural injections    . ganglion cyst removed from right wrist    . PAIN PUMP REMOVAL N/A 06/25/2016   Procedure: Removal of Implantable Pulse Generator;  Surgeon: Erline Levine, MD;  Location: [place];  Service: Neurosurgery;   Laterality: N/A;  . rotator cuff surgery Right   . SPINAL CORD STIMULATOR INSERTION    . SPINAL CORD STIMULATOR INSERTION N/A 06/25/2016   Procedure: LUMBAR SPINAL CORD STIMULATOR INSERTION AND  REMOVAL OF OLD PADDLE STIMULATOR;  Surgeon: Erline Levine, MD;  Location: [place];  Service: Neurosurgery;  Laterality: N/A;  . TOTAL HIP ARTHROPLASTY Right 08/02/2017  . TOTAL HIP ARTHROPLASTY Right 08/02/2017   Procedure: RIGHT TOTAL HIP ARTHROPLASTY ANTERIOR APPROACH;  Surgeon: Renette Butters, MD;  Location: [place];  Service: Orthopedics;  Laterality: Right;  . warts removed from knees  70's  . wisdom teeth extracted      Allergies  Allergen Reactions  . Ambien [Zolpidem Tartrate] Other (See Comments)    Disorientation/anxiety  . Morphine And Related Nausea And Vomiting and Other (See Comments)    Migraines/clammy  . Flexeril [Cyclobenzaprine] Itching    Severe itching   Medications: Lamictal 400 mg at bedtime Trazodone 200 mg at bedtime Latuda 80 mg at bedtime Prazosin 2 mg at bedtime Valium 10 mg at bedtime Iburprofen 800 mg PRN Baclofen 10 mg PRN Spasm  Social history: Single, former smoker.  One pack per day x10 years.  Quit 2015.  Rare EtOH.  Family History  Problem Relation Age of Onset  . Hypertension Mother   . Diabetes Father     ROS: Currently denies lightheadedness, dizziness, Fever, chills, CP, SOB.   No personal history of DVT, PE, MI, or  CVA. No loose teeth or dentures.  Decreased hearing.  Wears glasses or contacts. All other systems have been reviewed and were otherwise currently negative with the exception of those mentioned in the HPI and as above.  Objective: Vitals: Ht: 5 feet 11 inches wt: 173 Temp: 97.4 BP: 105/70 Pulse: 66 O2 99% on room air.   Physical Exam: General: Alert, NAD. Trendelenberg Gait  HEENT: EOMI, Good Neck Extension  Pulm: No increased work of breathing.  Clear B/L A/P w/o crackle or wheeze.  CV: RRR, No m/g/r appreciated  GI: soft, NT,  ND Neuro: Neuro without gross focal deficit.  Sensation intact distally Skin: No lesions in the area of chief complaint MSK/Surgical Site: Left hip pain with passive ROM.  Positive Stinchfield.  5/5 strength.  NVI.  Sensation intact distally.  Imaging Review Plain radiographs demonstrate severe degenerative joint disease of the left hip.   Preoperative templating of the joint replacement has been completed, documented, and submitted to the Operating Room personnel in order to optimize intra-operative equipment management.  Assessment: OA LEFT HIP Principal Problem:   Primary osteoarthritis of left hip Active Problems:   Anxiety   Chronic back pain   PTSD (post-traumatic stress disorder)   Plan: Plan for Procedure(s): TOTAL HIP ARTHROPLASTY ANTERIOR APPROACH  The patient history, physical exam, clinical judgement of the provider and imaging are consistent with end stage degenerative joint disease and total joint arthroplasty is deemed medically necessary. The treatment options including medical management, injection therapy, and arthroplasty were discussed at length. The risks and benefits of Procedure(s): TOTAL HIP ARTHROPLASTY ANTERIOR APPROACH were presented and reviewed.  The risks of nonoperative treatment, versus surgical intervention including but not limited to continued pain, aseptic loosening, stiffness, dislocation/subluxation, infection, bleeding, nerve injury, blood clots, cardiopulmonary complications, morbidity, mortality, among others were discussed. The patient verbalizes understanding and wishes to proceed with the plan.  Patient is being admitted for surgery, pain control, PT, prophylactic antibiotics, VTE prophylaxis, progressive ambulation, ADL's and discharge planning.   Dental prophylaxis discussed and recommended for 2 years postoperatively.   The patient does meet the criteria for TXA which will be used perioperatively.    ASA 81 mg BID will be used  postoperatively for DVT prophylaxis in addition to SCDs, and early ambulation.  No gabapentin - it makes her feel out of it  Dilaudid for IV breakthrough pain-morphine causes migraines and vomiting.  Oxycodone/Tylenol for p.o. pain medicine  Continue IBU for inflammation.  Make omeprazole Rx for gastric protection.  Continue Baclofen for spasm.  The patient is planning to be discharged home in care of fiance Berton LanGerry Hewitt [phone].     Patient's anticipated LOS is less than 2 midnights, meeting these requirements: - Younger than 265 - Lives within 1 hour of care - Has a competent adult at home to recover with post-op recover - NO history of  - Chronic pain requiring opiods  - Diabetes  - Coronary Artery Disease  - Heart failure  - Heart attack  - Stroke  - DVT/VTE  - Cardiac arrhythmia  - Respiratory Failure/COPD  - Renal failure  - Anemia  - Advanced Liver disease    Albina BilletHenry Calvin Martensen III, PA-C 08/08/2018 2:58 PM

## 2018-08-09 DIAGNOSIS — M1612 Unilateral primary osteoarthritis, left hip: Secondary | ICD-10-CM | POA: Diagnosis not present

## 2018-08-17 ENCOUNTER — Other Ambulatory Visit (HOSPITAL_COMMUNITY): Payer: Self-pay | Admitting: *Deleted

## 2018-08-17 NOTE — Patient Instructions (Addendum)
YOU NEED TO HAVE A COVID 19 TEST ON_Friday 7/24______ @__3 :45_____, THIS TEST MUST BE DONE BEFORE SURGERY, [facility]. ONCE YOUR COVID TEST IS COMPLETED, PLEASE BEGIN THE QUARANTINE INSTRUCTIONS AS OUTLINED IN YOUR HANDOUT.                Kayla OdorSandra Howard    Your procedure is scheduled on: 08-29-2018   [facility]  Report to admitting at 7:30 AM      Call this number if you have problems the morning of surgery [phone]    Remember: BRUSH YOUR TEETH MORNING OF SURGERY AND RINSE YOUR MOUTH OUT, NO CHEWING GUM CANDY OR MINTS.   NO SOLID FOOD AFTER MIDNIGHT THE NIGHT PRIOR TO SURGERY.  NOTHING BY MOUTH EXCEPT CLEAR LIQUIDS UNTIL 7:00 AM.  PLEASE FINISH ENSURE DRINK PER SURGEON ORDER 3 HOURS PRIOR TO SCHEDULED SURGERY TIME WHICH NEEDS TO BE COMPLETED AT 7:00 AM.   CLEAR LIQUID DIET   Foods Allowed                                                                     Foods Excluded  Coffee and tea, regular and decaf                             liquids that you cannot  Plain Jell-O any favor except red or purple                                           see through such as: Fruit ices (not with fruit pulp)                                     milk, soups, orange juice  Iced Popsicles                                    All solid food Carbonated beverages, regular and diet                                    Cranberry, grape and apple juices Sports drinks like Gatorade Lightly seasoned clear broth or consume(fat free) Sugar, honey syrup  Sample Menu Breakfast                                Lunch                                     Supper Cranberry juice                    Beef broth  Chicken broth Jell-O                                     Grape juice                           Apple juice Coffee or tea                        Jell-O                                      Popsicle                                          Coffee or tea                        Coffee or tea  _____________________________________________________________________     Take these medicines the morning of surgery with A SIP OF WATER: none    Bring your control for stimulator                                You may not have any metal on your body including hair pins and              piercings           Do not wear jewelry, make-up, lotions, powders or perfumes, deodorant             Do not wear nail polish.  Do not shave  48 hours prior to surgery.                Do not bring valuables to the hospital. [place].  Contacts, dentures or bridgework may not be worn into surgery.       _____________________________________________________________________             [name] - Preparing for Surgery Before surgery, you can play an important role.   Because skin is not sterile, your skin needs to be as free of germs as possible.   You can reduce the number of germs on your skin by washing with CHG (chlorahexidine gluconate) soap before surgery.   CHG is an antiseptic cleaner which kills germs and bonds with the skin to continue killing germs even after washing. Please DO NOT use if you have an allergy to CHG or antibacterial soaps.   If your skin becomes reddened/irritated stop using the CHG and inform your nurse when you arrive at Short Stay. Do not shave (including legs and underarms) for at least 48 hours prior to the first CHG shower.  Please follow these instructions carefully:  1.  Shower with CHG Soap the night before surgery and the  morning of Surgery.  2.  If you choose to wash your hair, wash your hair first as usual with your  normal  shampoo.  3.  After you shampoo, rinse your hair and body thoroughly to remove the  shampoo.  4.  Use CHG as you would any other liquid soap.  You can apply  chg directly  to the skin and wash                       Gently with a scrungie or clean washcloth.  5.  Apply the CHG Soap to your body ONLY FROM THE NECK DOWN.   Do not use on face/ open                           Wound or open sores. Avoid contact with eyes, ears mouth and genitals (private parts).                       Wash face,  Genitals (private parts) with your normal soap.             6.  Wash thoroughly, paying special attention to the area where your surgery  will be performed.  7.  Thoroughly rinse your body with warm water from the neck down.  8.  DO NOT shower/wash with your normal soap after using and rinsing off  the CHG Soap.             9.  Pat yourself dry with a clean towel.            10.  Wear clean pajamas.            11.  Place clean sheets on your bed the night of your first shower and do not  sleep with pets.  Day of Surgery : Do not apply any lotions/deodorants the morning of surgery.  Please wear clean clothes to the hospital/surgery center.   FAILURE TO FOLLOW THESE INSTRUCTIONS MAY RESULT IN THE CANCELLATION OF YOUR SURGERY PATIENT SIGNATURE_________________________________  NURSE SIGNATURE__________________________________  ________________________________________________________________________   Kayla MireIncentive Spirometer  An incentive spirometer is a tool that can help keep your lungs clear and active. This tool measures how well you are filling your lungs with each breath. Taking long deep breaths may help reverse or decrease the chance of developing breathing (pulmonary) problems (especially infection) following:  A long period of time when you are unable to move or be active. BEFORE THE PROCEDURE   If the spirometer includes an indicator to show your best effort, your nurse or respiratory therapist will set it to a desired goal.  If possible, sit up straight or lean slightly forward. Try not to slouch.  Hold the incentive spirometer in an upright  position. INSTRUCTIONS FOR USE  1. Sit on the edge of your bed if possible, or sit up as far as you can in bed or on a chair. 2. Hold the incentive spirometer in an upright position. 3. Breathe out normally. 4. Place the mouthpiece in your mouth and seal your lips tightly around it. 5. Breathe in slowly and as deeply as possible, raising the piston or the ball toward the top of the column. 6. Hold your breath for 3-5 seconds or for as long as possible. Allow the piston or ball to fall to the bottom of the column. 7. Remove the mouthpiece from your mouth and breathe out normally. 8. Rest for a few seconds and repeat Steps 1 through 7 at least 10 times every 1-2 hours when you are awake. Take your time and take a few normal breaths between deep breaths. 9. The spirometer may include an indicator to show  your best effort. Use the indicator as a goal to work toward during each repetition. 10. After each set of 10 deep breaths, practice coughing to be sure your lungs are clear. If you have an incision (the cut made at the time of surgery), support your incision when coughing by placing a pillow or rolled up towels firmly against it. Once you are able to get out of bed, walk around indoors and cough well. You may stop using the incentive spirometer when instructed by your caregiver.  RISKS AND COMPLICATIONS  Take your time so you do not get dizzy or light-headed.  If you are in pain, you may need to take or ask for pain medication before doing incentive spirometry. It is harder to take a deep breath if you are having pain. AFTER USE  Rest and breathe slowly and easily.  It can be helpful to keep track of a log of your progress. Your caregiver can provide you with a simple table to help with this. If you are using the spirometer at home, follow these instructions: SEEK MEDICAL CARE IF:   You are having difficultly using the spirometer.  You have trouble using the spirometer as often as  instructed.  Your pain medication is not giving enough relief while using the spirometer.  You develop fever of 100.5 F (38.1 C) or higher. SEEK IMMEDIATE MEDICAL CARE IF:   You cough up bloody sputum that had not been present before.  You develop fever of 102 F (38.9 C) or greater.  You develop worsening pain at or near the incision site. MAKE SURE YOU:   Understand these instructions.  Will watch your condition.  Will get help right away if you are not doing well or get worse. Document Released: 05/31/2006 Document Revised: 04/12/2011 Document Reviewed: 08/01/2006 [facility] Patient Information 2014 [place] [place].   ________________________________________________________________________

## 2018-08-18 ENCOUNTER — Encounter (HOSPITAL_COMMUNITY)
Admission: RE | Admit: 2018-08-18 | Discharge: 2018-08-18 | Disposition: A | Discharge status: Home / Self Care | Payer: Medicare Other | Source: Ambulatory Visit | Attending: Orthopedic Surgery | Admitting: Orthopedic Surgery

## 2018-08-18 ENCOUNTER — Other Ambulatory Visit: Payer: Self-pay

## 2018-08-18 ENCOUNTER — Other Ambulatory Visit: Payer: Self-pay | Admitting: Orthopedic Surgery

## 2018-08-18 ENCOUNTER — Encounter (HOSPITAL_COMMUNITY): Admission: RE | Admit: 2018-08-18 | Payer: Medicare Other | Source: Ambulatory Visit

## 2018-08-18 ENCOUNTER — Encounter (HOSPITAL_COMMUNITY): Payer: Self-pay

## 2018-08-18 DIAGNOSIS — M1612 Unilateral primary osteoarthritis, left hip: Secondary | ICD-10-CM | POA: Diagnosis not present

## 2018-08-18 DIAGNOSIS — G4700 Insomnia, unspecified: Secondary | ICD-10-CM | POA: Diagnosis not present

## 2018-08-18 DIAGNOSIS — M549 Dorsalgia, unspecified: Secondary | ICD-10-CM | POA: Insufficient documentation

## 2018-08-18 DIAGNOSIS — Z79899 Other long term (current) drug therapy: Secondary | ICD-10-CM | POA: Diagnosis not present

## 2018-08-18 DIAGNOSIS — F4310 Post-traumatic stress disorder, unspecified: Secondary | ICD-10-CM | POA: Diagnosis not present

## 2018-08-18 DIAGNOSIS — Z01812 Encounter for preprocedural laboratory examination: Secondary | ICD-10-CM | POA: Insufficient documentation

## 2018-08-18 DIAGNOSIS — G8929 Other chronic pain: Secondary | ICD-10-CM | POA: Insufficient documentation

## 2018-08-18 DIAGNOSIS — F419 Anxiety disorder, unspecified: Secondary | ICD-10-CM | POA: Insufficient documentation

## 2018-08-18 LAB — URINALYSIS, ROUTINE W REFLEX MICROSCOPIC
Bacteria, UA: NONE SEEN
Bilirubin Urine: NEGATIVE
Glucose, UA: NEGATIVE mg/dL
Ketones, ur: NEGATIVE mg/dL
Leukocytes,Ua: NEGATIVE
Nitrite: NEGATIVE
Protein, ur: NEGATIVE mg/dL
Specific Gravity, Urine: 1.013 (ref 1.005–1.030)
pH: 5 (ref 5.0–8.0)

## 2018-08-18 LAB — BASIC METABOLIC PANEL
Anion gap: 12 (ref 5–15)
BUN: 17 mg/dL (ref 6–20)
CO2: 26 mmol/L (ref 22–32)
Calcium: 9.3 mg/dL (ref 8.9–10.3)
Chloride: 100 mmol/L (ref 98–111)
Creatinine, Ser: 0.76 mg/dL (ref 0.44–1.00)
GFR calc Af Amer: >60 mL/min (ref >60)
GFR calc non Af Amer: >60 mL/min (ref >60)
Glucose, Bld: 97 mg/dL (ref 70–99)
Potassium: 3.7 mmol/L (ref 3.5–5.1)
Sodium: 138 mmol/L (ref 135–145)

## 2018-08-18 LAB — CBC
HCT: 43 % (ref 36.0–46.0)
Hemoglobin: 14.1 g/dL (ref 12.0–15.0)
MCH: 30.3 pg (ref 26.0–34.0)
MCHC: 32.8 g/dL (ref 30.0–36.0)
MCV: 92.5 fL (ref 80.0–100.0)
Platelets: 271 10*3/uL (ref 150–400)
RBC: 4.65 MIL/uL (ref 3.87–5.11)
RDW: 13.5 % (ref 11.5–15.5)
WBC: 9.1 10*3/uL (ref 4.0–10.5)
nRBC: 0 % (ref 0.0–0.2)

## 2018-08-18 LAB — SURGICAL PCR SCREEN
MRSA, PCR: NEGATIVE
Staphylococcus aureus: NEGATIVE

## 2018-08-18 NOTE — Care Plan (Signed)
Met with patient in the office prior to surgery. She is planning to discharge to home with family and go to [place]. Appointment has been arranged and given to patient at her request. She will follow up with Dr. Percell Miller in the office. She has equipment at home. Choice offered.    Ladell Heads, [place]

## 2018-08-25 ENCOUNTER — Other Ambulatory Visit (HOSPITAL_COMMUNITY)
Admission: RE | Admit: 2018-08-25 | Discharge: 2018-08-25 | Disposition: A | Discharge status: Home / Self Care | Payer: Medicare Other | Source: Ambulatory Visit | Attending: Orthopedic Surgery | Admitting: Orthopedic Surgery

## 2018-08-25 DIAGNOSIS — Z1159 Encounter for screening for other viral diseases: Secondary | ICD-10-CM | POA: Diagnosis not present

## 2018-08-26 LAB — SARS CORONAVIRUS 2 (TAT 6-24 HRS): SARS Coronavirus 2: NEGATIVE

## 2018-08-28 MED ORDER — BUPIVACAINE LIPOSOME 1.3 % IJ SUSP
10.0000 mL | Freq: Once | INTRAMUSCULAR | Status: DC
  Filled 2018-08-28 (×2): qty 10

## 2018-08-29 ENCOUNTER — Encounter (HOSPITAL_COMMUNITY): Payer: Self-pay

## 2018-08-29 ENCOUNTER — Encounter (HOSPITAL_COMMUNITY)
Admission: RE | Disposition: A | Discharge status: Home / Self Care | Payer: Self-pay | Source: Other Acute Inpatient Hospital | Attending: Orthopedic Surgery

## 2018-08-29 ENCOUNTER — Ambulatory Visit (HOSPITAL_COMMUNITY): Payer: Medicare Other

## 2018-08-29 ENCOUNTER — Ambulatory Visit (HOSPITAL_COMMUNITY): Payer: Medicare Other | Admitting: Physician Assistant

## 2018-08-29 ENCOUNTER — Observation Stay (HOSPITAL_COMMUNITY)
Admission: RE | Admit: 2018-08-29 | Discharge: 2018-08-30 | Disposition: A | Discharge status: Home / Self Care | Payer: Medicare Other | Source: Other Acute Inpatient Hospital | Attending: Orthopedic Surgery | Admitting: Orthopedic Surgery

## 2018-08-29 ENCOUNTER — Ambulatory Visit (HOSPITAL_COMMUNITY): Payer: Medicare Other | Admitting: Anesthesiology

## 2018-08-29 ENCOUNTER — Other Ambulatory Visit: Payer: Self-pay

## 2018-08-29 DIAGNOSIS — Z9682 Presence of neurostimulator: Secondary | ICD-10-CM | POA: Diagnosis not present

## 2018-08-29 DIAGNOSIS — F4310 Post-traumatic stress disorder, unspecified: Secondary | ICD-10-CM | POA: Diagnosis not present

## 2018-08-29 DIAGNOSIS — G43909 Migraine, unspecified, not intractable, without status migrainosus: Secondary | ICD-10-CM | POA: Diagnosis not present

## 2018-08-29 DIAGNOSIS — Z471 Aftercare following joint replacement surgery: Secondary | ICD-10-CM | POA: Diagnosis not present

## 2018-08-29 DIAGNOSIS — G4700 Insomnia, unspecified: Secondary | ICD-10-CM | POA: Insufficient documentation

## 2018-08-29 DIAGNOSIS — F419 Anxiety disorder, unspecified: Secondary | ICD-10-CM | POA: Diagnosis not present

## 2018-08-29 DIAGNOSIS — Z87891 Personal history of nicotine dependence: Secondary | ICD-10-CM | POA: Insufficient documentation

## 2018-08-29 DIAGNOSIS — Z96641 Presence of right artificial hip joint: Secondary | ICD-10-CM | POA: Insufficient documentation

## 2018-08-29 DIAGNOSIS — Z419 Encounter for procedure for purposes other than remedying health state, unspecified: Secondary | ICD-10-CM

## 2018-08-29 DIAGNOSIS — Z79899 Other long term (current) drug therapy: Secondary | ICD-10-CM | POA: Diagnosis not present

## 2018-08-29 DIAGNOSIS — Z96642 Presence of left artificial hip joint: Secondary | ICD-10-CM | POA: Diagnosis not present

## 2018-08-29 DIAGNOSIS — M1612 Unilateral primary osteoarthritis, left hip: Principal | ICD-10-CM | POA: Diagnosis present

## 2018-08-29 DIAGNOSIS — G8929 Other chronic pain: Secondary | ICD-10-CM | POA: Diagnosis present

## 2018-08-29 DIAGNOSIS — M961 Postlaminectomy syndrome, not elsewhere classified: Secondary | ICD-10-CM | POA: Diagnosis not present

## 2018-08-29 DIAGNOSIS — M549 Dorsalgia, unspecified: Secondary | ICD-10-CM | POA: Diagnosis present

## 2018-08-29 DIAGNOSIS — M1610 Unilateral primary osteoarthritis, unspecified hip: Secondary | ICD-10-CM | POA: Diagnosis present

## 2018-08-29 DIAGNOSIS — F329 Major depressive disorder, single episode, unspecified: Secondary | ICD-10-CM | POA: Insufficient documentation

## 2018-08-29 DIAGNOSIS — F418 Other specified anxiety disorders: Secondary | ICD-10-CM | POA: Diagnosis not present

## 2018-08-29 HISTORY — PX: TOTAL HIP ARTHROPLASTY: SHX124

## 2018-08-29 LAB — PREGNANCY, URINE: Preg Test, Ur: NEGATIVE

## 2018-08-29 SURGERY — ARTHROPLASTY, HIP, TOTAL, ANTERIOR APPROACH
Anesthesia: Spinal | Laterality: Left

## 2018-08-29 MED ORDER — SODIUM CHLORIDE (PF) 0.9 % IJ SOLN
INTRAMUSCULAR | Status: AC
  Filled 2018-08-29: qty 50

## 2018-08-29 MED ORDER — DEXAMETHASONE SODIUM PHOSPHATE 10 MG/ML IJ SOLN
10.0000 mg | Freq: Once | INTRAMUSCULAR | Status: AC
  Administered 2018-08-30: 10 mg via INTRAVENOUS
  Filled 2018-08-29: qty 1

## 2018-08-29 MED ORDER — PHENOL 1.4 % MT LIQD: 1.0000 | OROMUCOSAL | Status: DC | PRN

## 2018-08-29 MED ORDER — MENTHOL 3 MG MT LOZG: 1.0000 | LOZENGE | OROMUCOSAL | Status: DC | PRN

## 2018-08-29 MED ORDER — BACLOFEN 10 MG PO TABS
10.0000 mg | ORAL_TABLET | Freq: Three times a day (TID) | ORAL | Status: DC | PRN
  Administered 2018-08-29: 10 mg via ORAL
  Filled 2018-08-29: qty 1

## 2018-08-29 MED ORDER — 0.9 % SODIUM CHLORIDE (POUR BTL) OPTIME
TOPICAL | Status: DC | PRN
  Administered 2018-08-29: 1000 mL

## 2018-08-29 MED ORDER — TRANEXAMIC ACID-NACL 1000-0.7 MG/100ML-% IV SOLN
1000.0000 mg | INTRAVENOUS | Status: AC
  Administered 2018-08-29: 1000 mg via INTRAVENOUS
  Filled 2018-08-29: qty 100

## 2018-08-29 MED ORDER — PROPOFOL 500 MG/50ML IV EMUL
INTRAVENOUS | Status: DC | PRN
  Administered 2018-08-29: 100 ug/kg/min via INTRAVENOUS

## 2018-08-29 MED ORDER — LACTATED RINGERS IV SOLN
INTRAVENOUS | Status: DC
  Administered 2018-08-29 (×3): via INTRAVENOUS

## 2018-08-29 MED ORDER — EPHEDRINE 5 MG/ML INJ
INTRAVENOUS | Status: AC
  Filled 2018-08-29: qty 10

## 2018-08-29 MED ORDER — ASPIRIN EC 81 MG PO TBEC: 81.0000 mg | DELAYED_RELEASE_TABLET | Freq: Two times a day (BID) | ORAL | 0 refills | Status: DC

## 2018-08-29 MED ORDER — LACTATED RINGERS IV SOLN
INTRAVENOUS | Status: DC
  Administered 2018-08-29 – 2018-08-30 (×3): via INTRAVENOUS

## 2018-08-29 MED ORDER — DEXAMETHASONE SODIUM PHOSPHATE 10 MG/ML IJ SOLN
INTRAMUSCULAR | Status: DC | PRN
  Administered 2018-08-29: 10 mg via INTRAVENOUS

## 2018-08-29 MED ORDER — SORBITOL 70 % SOLN
30.0000 mL | Freq: Every day | Status: DC | PRN
  Filled 2018-08-29: qty 30

## 2018-08-29 MED ORDER — SODIUM CHLORIDE FLUSH 0.9 % IV SOLN
INTRAVENOUS | Status: DC | PRN
  Administered 2018-08-29: 20 mL

## 2018-08-29 MED ORDER — FENTANYL CITRATE (PF) 100 MCG/2ML IJ SOLN
INTRAMUSCULAR | Status: AC
  Filled 2018-08-29: qty 2

## 2018-08-29 MED ORDER — PROPOFOL 10 MG/ML IV BOLUS
INTRAVENOUS | Status: DC | PRN
  Administered 2018-08-29 (×2): 20 mg via INTRAVENOUS
  Administered 2018-08-29 (×2): 40 mg via INTRAVENOUS

## 2018-08-29 MED ORDER — OXYCODONE HCL 5 MG PO TABS: 5.0000 mg | ORAL_TABLET | ORAL | 0 refills | Status: AC | PRN

## 2018-08-29 MED ORDER — PROPOFOL 10 MG/ML IV BOLUS
INTRAVENOUS | Status: AC
  Filled 2018-08-29: qty 60

## 2018-08-29 MED ORDER — BUPIVACAINE IN DEXTROSE 0.75-8.25 % IT SOLN
INTRATHECAL | Status: DC | PRN
  Administered 2018-08-29: 1.8 mL via INTRATHECAL

## 2018-08-29 MED ORDER — DOCUSATE SODIUM 100 MG PO CAPS
100.0000 mg | ORAL_CAPSULE | Freq: Two times a day (BID) | ORAL | Status: DC
  Administered 2018-08-29 – 2018-08-30 (×2): 100 mg via ORAL
  Filled 2018-08-29 (×2): qty 1

## 2018-08-29 MED ORDER — EPHEDRINE SULFATE-NACL 50-0.9 MG/10ML-% IV SOSY
PREFILLED_SYRINGE | INTRAVENOUS | Status: DC | PRN
  Administered 2018-08-29 (×2): 5 mg via INTRAVENOUS

## 2018-08-29 MED ORDER — ASPIRIN 81 MG PO CHEW
81.0000 mg | CHEWABLE_TABLET | Freq: Two times a day (BID) | ORAL | Status: DC
  Administered 2018-08-29 – 2018-08-30 (×2): 81 mg via ORAL
  Filled 2018-08-29 (×2): qty 1

## 2018-08-29 MED ORDER — DEXAMETHASONE SODIUM PHOSPHATE 10 MG/ML IJ SOLN
INTRAMUSCULAR | Status: AC
  Filled 2018-08-29: qty 1

## 2018-08-29 MED ORDER — OXYCODONE HCL 5 MG PO TABS
5.0000 mg | ORAL_TABLET | ORAL | Status: DC | PRN
  Administered 2018-08-29 – 2018-08-30 (×5): 10 mg via ORAL
  Filled 2018-08-29 (×5): qty 2

## 2018-08-29 MED ORDER — ONDANSETRON HCL 4 MG/2ML IJ SOLN: 4.0000 mg | Freq: Four times a day (QID) | INTRAMUSCULAR | Status: DC | PRN

## 2018-08-29 MED ORDER — ONDANSETRON HCL 4 MG PO TABS: 4.0000 mg | ORAL_TABLET | Freq: Four times a day (QID) | ORAL | Status: DC | PRN

## 2018-08-29 MED ORDER — MAGNESIUM CITRATE PO SOLN: 1.0000 | Freq: Once | ORAL | Status: DC | PRN

## 2018-08-29 MED ORDER — ONDANSETRON HCL 4 MG/2ML IJ SOLN
INTRAMUSCULAR | Status: DC | PRN
  Administered 2018-08-29: 4 mg via INTRAVENOUS

## 2018-08-29 MED ORDER — ONDANSETRON HCL 4 MG PO TABS: 4.0000 mg | ORAL_TABLET | Freq: Three times a day (TID) | ORAL | 0 refills | Status: DC | PRN

## 2018-08-29 MED ORDER — PROPOFOL 10 MG/ML IV BOLUS
INTRAVENOUS | Status: AC
  Filled 2018-08-29: qty 40

## 2018-08-29 MED ORDER — CELECOXIB 200 MG PO CAPS
400.0000 mg | ORAL_CAPSULE | Freq: Once | ORAL | Status: AC
  Administered 2018-08-29: 400 mg via ORAL
  Filled 2018-08-29: qty 2

## 2018-08-29 MED ORDER — TRAZODONE HCL 100 MG PO TABS
300.0000 mg | ORAL_TABLET | Freq: Every day | ORAL | Status: DC
  Administered 2018-08-29: 22:00:00 300 mg via ORAL
  Filled 2018-08-29: qty 3

## 2018-08-29 MED ORDER — LAMOTRIGINE 100 MG PO TABS
400.0000 mg | ORAL_TABLET | Freq: Every day | ORAL | Status: DC
  Administered 2018-08-29: 400 mg via ORAL
  Filled 2018-08-29: qty 4

## 2018-08-29 MED ORDER — ACETAMINOPHEN 500 MG PO TABS
1000.0000 mg | ORAL_TABLET | Freq: Once | ORAL | Status: AC
  Administered 2018-08-29: 1000 mg via ORAL
  Filled 2018-08-29: qty 2

## 2018-08-29 MED ORDER — POLYETHYLENE GLYCOL 3350 17 G PO PACK: 17.0000 g | PACK | Freq: Every day | ORAL | Status: DC | PRN

## 2018-08-29 MED ORDER — DIPHENHYDRAMINE HCL 12.5 MG/5ML PO ELIX: 12.5000 mg | ORAL_SOLUTION | ORAL | Status: DC | PRN

## 2018-08-29 MED ORDER — BUPIVACAINE LIPOSOME 1.3 % IJ SUSP
INTRAMUSCULAR | Status: DC | PRN
  Administered 2018-08-29: 10 mL

## 2018-08-29 MED ORDER — HYDROMORPHONE HCL 1 MG/ML IJ SOLN
0.5000 mg | INTRAMUSCULAR | Status: DC | PRN
  Administered 2018-08-29: 1 mg via INTRAVENOUS
  Administered 2018-08-29: 0.5 mg via INTRAVENOUS
  Administered 2018-08-29: 1 mg via INTRAVENOUS
  Filled 2018-08-29 (×3): qty 1

## 2018-08-29 MED ORDER — DIAZEPAM 5 MG PO TABS
10.0000 mg | ORAL_TABLET | Freq: Every day | ORAL | Status: DC
  Administered 2018-08-29: 10 mg via ORAL
  Filled 2018-08-29: qty 2

## 2018-08-29 MED ORDER — POVIDONE-IODINE 10 % EX SWAB: 2.0000 "application " | Freq: Once | CUTANEOUS | Status: DC

## 2018-08-29 MED ORDER — PRAZOSIN HCL 1 MG PO CAPS
2.0000 mg | ORAL_CAPSULE | Freq: Every day | ORAL | Status: DC
  Administered 2018-08-29: 22:00:00 2 mg via ORAL
  Filled 2018-08-29: qty 2

## 2018-08-29 MED ORDER — METOCLOPRAMIDE HCL 5 MG/ML IJ SOLN: 5.0000 mg | Freq: Three times a day (TID) | INTRAMUSCULAR | Status: DC | PRN

## 2018-08-29 MED ORDER — PROPOFOL 10 MG/ML IV BOLUS
INTRAVENOUS | Status: AC
  Filled 2018-08-29: qty 20

## 2018-08-29 MED ORDER — IBUPROFEN 400 MG PO TABS: 800.0000 mg | ORAL_TABLET | Freq: Three times a day (TID) | ORAL | Status: DC | PRN

## 2018-08-29 MED ORDER — MIDAZOLAM HCL 5 MG/5ML IJ SOLN
INTRAMUSCULAR | Status: DC | PRN
  Administered 2018-08-29: 2 mg via INTRAVENOUS

## 2018-08-29 MED ORDER — ONDANSETRON HCL 4 MG/2ML IJ SOLN
INTRAMUSCULAR | Status: AC
  Filled 2018-08-29: qty 2

## 2018-08-29 MED ORDER — METOCLOPRAMIDE HCL 5 MG PO TABS: 5.0000 mg | ORAL_TABLET | Freq: Three times a day (TID) | ORAL | Status: DC | PRN

## 2018-08-29 MED ORDER — FENTANYL CITRATE (PF) 100 MCG/2ML IJ SOLN
25.0000 ug | INTRAMUSCULAR | Status: DC | PRN
  Administered 2018-08-29 (×3): 50 ug via INTRAVENOUS

## 2018-08-29 MED ORDER — FENTANYL CITRATE (PF) 100 MCG/2ML IJ SOLN
INTRAMUSCULAR | Status: AC
  Filled 2018-08-29: qty 4

## 2018-08-29 MED ORDER — ACETAMINOPHEN 500 MG PO TABS
1000.0000 mg | ORAL_TABLET | Freq: Four times a day (QID) | ORAL | Status: AC
  Administered 2018-08-29 – 2018-08-30 (×4): 1000 mg via ORAL
  Filled 2018-08-29 (×5): qty 2

## 2018-08-29 MED ORDER — ACETAMINOPHEN 325 MG PO TABS: 325.0000 mg | ORAL_TABLET | Freq: Four times a day (QID) | ORAL | Status: DC | PRN

## 2018-08-29 MED ORDER — CEFAZOLIN SODIUM-DEXTROSE 2-4 GM/100ML-% IV SOLN
2.0000 g | INTRAVENOUS | Status: AC
  Administered 2018-08-29: 10:00:00 2 g via INTRAVENOUS
  Filled 2018-08-29: qty 100

## 2018-08-29 MED ORDER — MIDAZOLAM HCL 2 MG/2ML IJ SOLN
INTRAMUSCULAR | Status: AC
  Filled 2018-08-29: qty 2

## 2018-08-29 MED ORDER — CEFAZOLIN SODIUM-DEXTROSE 1-4 GM/50ML-% IV SOLN
1.0000 g | Freq: Four times a day (QID) | INTRAVENOUS | Status: AC
  Administered 2018-08-29 (×2): 1 g via INTRAVENOUS
  Filled 2018-08-29 (×2): qty 50

## 2018-08-29 MED ORDER — CHLORHEXIDINE GLUCONATE 4 % EX LIQD: 60.0000 mL | Freq: Once | CUTANEOUS | Status: DC

## 2018-08-29 MED ORDER — FENTANYL CITRATE (PF) 100 MCG/2ML IJ SOLN
INTRAMUSCULAR | Status: DC | PRN
  Administered 2018-08-29 (×2): 50 ug via INTRAVENOUS

## 2018-08-29 MED ORDER — LURASIDONE HCL 20 MG PO TABS
60.0000 mg | ORAL_TABLET | Freq: Every day | ORAL | Status: DC
  Administered 2018-08-29: 60 mg via ORAL
  Filled 2018-08-29: qty 3

## 2018-08-29 MED ORDER — DOCUSATE SODIUM 100 MG PO CAPS: 100.0000 mg | ORAL_CAPSULE | Freq: Two times a day (BID) | ORAL | 0 refills | Status: DC

## 2018-08-29 MED ORDER — ACETAMINOPHEN 500 MG PO TABS: 1000.0000 mg | ORAL_TABLET | Freq: Three times a day (TID) | ORAL | 0 refills | Status: AC

## 2018-08-29 SURGICAL SUPPLY — 42 items
BLADE SAG 18X100X1.27 (BLADE) IMPLANT
BLADE SURG SZ10 CARB STEEL (BLADE) ×6 IMPLANT
CHLORAPREP W/TINT 26 (MISCELLANEOUS) ×3 IMPLANT
CLOSURE STERI-STRIP 1/2X4 (GAUZE/BANDAGES/DRESSINGS) ×1
CLSR STERI-STRIP ANTIMIC 1/2X4 (GAUZE/BANDAGES/DRESSINGS) ×2 IMPLANT
COVER PERINEAL POST (MISCELLANEOUS) ×3 IMPLANT
COVER SURGICAL LIGHT HANDLE (MISCELLANEOUS) ×3 IMPLANT
COVER WAND RF STERILE (DRAPES) IMPLANT
DECANTER SPIKE VIAL GLASS SM (MISCELLANEOUS) ×4 IMPLANT
DRAPE IMP U-DRAPE 54X76 (DRAPES) ×3 IMPLANT
DRAPE STERI IOBAN 125X83 (DRAPES) ×3 IMPLANT
DRAPE U-SHAPE 47X51 STRL (DRAPES) ×6 IMPLANT
DRSG MEPILEX BORDER 4X8 (GAUZE/BANDAGES/DRESSINGS) ×3 IMPLANT
ELECT BLADE TIP CTD 4 INCH (ELECTRODE) ×3 IMPLANT
GLOVE BIO SURGEON STRL SZ7.5 (GLOVE) ×6 IMPLANT
GLOVE BIOGEL PI IND STRL 8 (GLOVE) ×2 IMPLANT
GLOVE BIOGEL PI INDICATOR 8 (GLOVE) ×4
GOWN STRL REUS W/TWL LRG LVL3 (GOWN DISPOSABLE) ×3 IMPLANT
GOWN STRL REUS W/TWL XL LVL3 (GOWN DISPOSABLE) ×3 IMPLANT
HEAD BIOLOX HIP 36/-2.5 (Joint) IMPLANT
HIP BIOLOX HD 36/-2.5 (Joint) ×3 IMPLANT
HOLDER FOLEY CATH W/STRAP (MISCELLANEOUS) ×2 IMPLANT
INSERT TRIDENT POLY 36MM 0DEG (Insert) ×2 IMPLANT
KIT TURNOVER KIT A (KITS) IMPLANT
MANIFOLD NEPTUNE II (INSTRUMENTS) ×3 IMPLANT
NS IRRIG 1000ML POUR BTL (IV SOLUTION) ×3 IMPLANT
PACK ANTERIOR HIP CUSTOM (KITS) ×3 IMPLANT
PROTECTOR NERVE ULNAR (MISCELLANEOUS) ×3 IMPLANT
SCREW HEX LP 6.5X20 (Screw) ×2 IMPLANT
SHELL CLUSTERHOLE ACETABULAR 5 (Shell) ×2 IMPLANT
STEM HIP 127 DEG (Stem) ×2 IMPLANT
SUT MNCRL AB 4-0 PS2 18 (SUTURE) ×3 IMPLANT
SUT STRATAFIX 0 PDS 27 VIOLET (SUTURE) ×3
SUT VIC AB 0 CT1 36 (SUTURE) ×3 IMPLANT
SUT VIC AB 1 CT1 36 (SUTURE) ×3 IMPLANT
SUT VIC AB 2-0 CT1 27 (SUTURE) ×4
SUT VIC AB 2-0 CT1 TAPERPNT 27 (SUTURE) ×2 IMPLANT
SUTURE STRATFX 0 PDS 27 VIOLET (SUTURE) ×1 IMPLANT
TRAY FOLEY MTR SLVR 14FR STAT (SET/KITS/TRAYS/PACK) ×2 IMPLANT
TRAY FOLEY MTR SLVR 16FR STAT (SET/KITS/TRAYS/PACK) IMPLANT
WATER STERILE IRR 1000ML POUR (IV SOLUTION) ×6 IMPLANT
YANKAUER SUCT BULB TIP 10FT TU (MISCELLANEOUS) ×3 IMPLANT

## 2018-08-29 NOTE — Evaluation (Signed)
Physical Therapy Evaluation Patient Details Name: Kayla Howard MRN: [id number] DOB: Aug 12, 1965 Today's Date: 08/29/2018   History of Present Illness  53 yo female s/p L DA-THA on 08/29/18. PMH includes anxiety, chronic back pain, depression, PTSD, back surgery with spinal cord stimulator, R THA 2019.  Clinical Impression   Pt presents with L hip pain, decreased L hip strength, increased time and effort to perform mobility tasks, and decreased activity tolerance due to L hip pain. Pt to benefit from acute PT to address deficits. Pt ambulated hallway distance with RW and min guard assist for safety, verbal cuing for safety and form provided throughout. Pt educated on ankle pumps (20/hour) to perform this afternoon/evening to increase circulation, to pt's tolerance and limited by pain. PT to progress mobility as tolerated, and will continue to follow acutely.        Follow Up Recommendations Supervision for mobility/OOB;Follow surgeon's recommendation for DC plan and follow-up therapies    Equipment Recommendations  None recommended by PT    Recommendations for Other Services       Precautions / Restrictions Precautions Precautions: Fall Restrictions Weight Bearing Restrictions: No Other Position/Activity Restrictions: WBAT      Mobility  Bed Mobility Overal bed mobility: Needs Assistance Bed Mobility: Supine to Sit     Supine to sit: Min guard;HOB elevated     General bed mobility comments: Min guard for safety, verbal cuing for sequencing. Increased time and effort.  Transfers Overall transfer level: Needs assistance Equipment used: Rolling walker (2 wheeled) Transfers: Sit to/from Stand Sit to Stand: Min guard;From elevated surface         General transfer comment: min guard for safety. Verbal cuing for hand placement.  Ambulation/Gait Ambulation/Gait assistance: Min guard Gait Distance (Feet): 90 Feet Assistive device: Rolling walker (2 wheeled) Gait  Pattern/deviations: Step-to pattern;Decreased stride length;Step-through pattern;Trunk flexed Gait velocity: decr   General Gait Details: Min guard for safety, verbal cuing for upright posture, sequencing.  Stairs            Wheelchair Mobility    Modified Rankin (Stroke Patients Only)       Balance Overall balance assessment: Mild deficits observed, not formally tested                                           Pertinent Vitals/Pain Pain Assessment: 0-10 Pain Score: 7  Pain Location: L hip Pain Descriptors / Indicators: Sore Pain Intervention(s): Limited activity within patient's tolerance;Monitored during session;Premedicated before session;Repositioned;Ice applied    Home Living Family/patient expects to be discharged to:: Private residence Living Arrangements: Spouse/significant other;Other relatives(sister, fiance) Available Help at Discharge: Family;Available 24 hours/day Type of Home: House Home Access: Stairs to enter Entrance Stairs-Rails: None Entrance Stairs-Number of Steps: 1 Home Layout: One level Home Equipment: Walker - 2 wheels;Cane - single point;Bedside commode      Prior Function Level of Independence: Independent         Comments: 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     Hand Dominance   Dominant Hand: Right    Extremity/Trunk Assessment   Upper Extremity Assessment Upper Extremity Assessment: Overall WFL for tasks assessed    Lower Extremity Assessment Lower Extremity Assessment: Overall WFL for tasks assessed;LLE deficits/detail LLE Deficits / Details: suspected post-surgical hip pain; able to perform quad set, heel slide limited by pain LLE Sensation: WNL  Cervical / Trunk Assessment Cervical / Trunk Assessment: Normal  Communication   Communication: No difficulties  Cognition Arousal/Alertness: Awake/alert Behavior During Therapy: WFL for tasks assessed/performed Overall Cognitive Status: Within  Functional Limits for tasks assessed                                        General Comments      Exercises     Assessment/Plan    PT Assessment Patient needs continued PT services  PT Problem List Decreased strength;Decreased mobility;Decreased range of motion;Decreased activity tolerance;Decreased knowledge of use of DME;Decreased balance;Pain       PT Treatment Interventions DME instruction;Therapeutic activities;Gait training;Therapeutic exercise;Patient/family education;Balance training;Stair training;Functional mobility training    PT Goals (Current goals can be found in the Care Plan section)  Acute Rehab PT Goals PT Goal Formulation: With patient Time For Goal Achievement: 09/05/18 Potential to Achieve Goals: Good    Frequency 7X/week   Barriers to discharge        Co-evaluation               AM-PAC PT "6 Clicks" Mobility  Outcome Measure Help needed turning from your back to your side while in a flat bed without using bedrails?: A Little Help needed moving from lying on your back to sitting on the side of a flat bed without using bedrails?: A Little Help needed moving to and from a bed to a chair (including a wheelchair)?: A Little Help needed standing up from a chair using your arms (e.g., wheelchair or bedside chair)?: A Little Help needed to walk in hospital room?: A Little Help needed climbing 3-5 steps with a railing? : A Lot 6 Click Score: 17    End of Session Equipment Utilized During Treatment: Gait belt Activity Tolerance: Patient tolerated treatment well;Patient limited by pain Patient left: in chair;with call bell/phone within reach;with chair alarm set;with SCD's reapplied Nurse Communication: Mobility status PT Visit Diagnosis: Other abnormalities of gait and mobility (R26.89);Difficulty in walking, not elsewhere classified (R26.2)    Time: 3474-2595 PT Time Calculation (min) (ACUTE ONLY): 19 min   Charges:   PT  Evaluation $PT Eval Low Complexity: 1 Low          Delailah Spieth Conception Chancy, PT Acute Rehabilitation Services Pager [phone]  Office [phone]   Alexander Mcauley D Elonda Husky 08/29/2018, 4:18 PM

## 2018-08-29 NOTE — Anesthesia Preprocedure Evaluation (Addendum)
Anesthesia Evaluation  Patient identified by MRN, date of birth, ID band Patient awake    Reviewed: Allergy & Precautions, NPO status , Patient's Chart, lab work & pertinent test results  Airway Mallampati: I  TM Distance: >3 FB Neck ROM: Full  Mouth opening: Limited Mouth Opening  Dental no notable dental hx. (+) Teeth Intact, Dental Advisory Given, Caps,    Pulmonary neg pulmonary ROS, former smoker,    Pulmonary exam normal breath sounds clear to auscultation       Cardiovascular negative cardio ROS Normal cardiovascular exam Rhythm:Regular Rate:Normal     Neuro/Psych PSYCHIATRIC DISORDERS Anxiety Depression negative neurological ROS     GI/Hepatic negative GI ROS, Neg liver ROS,   Endo/Other  negative endocrine ROS  Renal/GU negative Renal ROS  negative genitourinary   Musculoskeletal  (+) Arthritis ,   Abdominal   Peds  Hematology negative hematology ROS (+)   Anesthesia Other Findings Spinal cord stimulator  Reproductive/Obstetrics                            Anesthesia Physical Anesthesia Plan  ASA: II  Anesthesia Plan: Spinal   Post-op Pain Management:    Induction: Intravenous  PONV Risk Score and Plan: 2 and Propofol infusion, Treatment may vary due to age or medical condition, Dexamethasone, Ondansetron and Midazolam  Airway Management Planned: Natural Airway  Additional Equipment:   Intra-op Plan:   Post-operative Plan:   Informed Consent: I have reviewed the patients History and Physical, chart, labs and discussed the procedure including the risks, benefits and alternatives for the proposed anesthesia with the patient or authorized representative who has indicated his/her understanding and acceptance.     Dental advisory given  Plan Discussed with: CRNA  Anesthesia Plan Comments:         Anesthesia Quick Evaluation

## 2018-08-29 NOTE — Discharge Instructions (Signed)

## 2018-08-29 NOTE — Care Plan (Signed)
Ortho Bundle Case Management Note  Patient Details  Name: Kayla Howard MRN: [id number] Date of Birth: 1965/09/27   Met with patient in the office prior to surgery. She is planning to discharge to home with family and go to [place]. Appointment has been arranged and given to patient at her request. She will follow up with Dr. Percell Miller in the office. She has equipment at home. Choice offered.                   DME Arranged:    DME Agency:     HH Arranged:    HH Agency:     Additional Comments: Please contact me with any questions of if this plan should need to change.  Ladell Heads,  [facility]  [phone] 08/29/2018, 8:46 AM

## 2018-08-29 NOTE — Interval H&P Note (Signed)
I participated in the care of this patient and agree with the above history, physical and evaluation. I performed a review of the history and a physical exam as detailed   Britne Borelli Daniel Ignace Mandigo MD  

## 2018-08-29 NOTE — Op Note (Signed)
08/29/2018  11:14 AM  PATIENT:  Kayla Howard   MRN: [id number]  PRE-OPERATIVE DIAGNOSIS:  OA LEFT HIP  POST-OPERATIVE DIAGNOSIS:  OA LEFT HIP  PROCEDURE:  Procedure(s): TOTAL HIP ARTHROPLASTY ANTERIOR APPROACH  PREOPERATIVE INDICATIONS:    Kayla Howard is an 53 y.o. female who has a diagnosis of Primary osteoarthritis of left hip and elected for surgical management after failing conservative treatment.  The risks benefits and alternatives were discussed with the patient including but not limited to the risks of nonoperative treatment, versus surgical intervention including infection, bleeding, nerve injury, periprosthetic fracture, the need for revision surgery, dislocation, leg length discrepancy, blood clots, cardiopulmonary complications, morbidity, mortality, among others, and they were willing to proceed.     OPERATIVE REPORT     SURGEON:   Renette Butters, MD    ASSISTANT:  Roxan Hockey, PA-C, he was present and scrubbed throughout the case, critical for completion in a timely fashion, and for retraction, instrumentation, and closure.     ANESTHESIA:  General    COMPLICATIONS:  None.     COMPONENTS:  Stryker acolade fit femur size 5 with a 36 mm -2.5 head ball and a PSL acetabular shell size 52 with a  polyethylene liner    PROCEDURE IN DETAIL:   The patient was met in the holding area and  identified.  The appropriate hip was identified and marked at the operative site.  The patient was then transported to the OR  and  placed under anesthesia per that record.  At that point, the patient was  placed in the supine position and  secured to the operating room table and all bony prominences padded. He received pre-operative antibiotics    The operative lower extremity was prepped from the iliac crest to the distal leg.  Sterile draping was performed.  Time out was performed prior to incision.      Skin incision was made just 2 cm lateral to the ASIS  extending in line  with the tensor fascia lata. Electrocautery was used to control all bleeders. I dissected down sharply to the fascia of the tensor fascia lata was confirmed that the muscle fibers beneath were running posteriorly. I then incised the fascia over the superficial tensor fascia lata in line with the incision. The fascia was elevated off the anterior aspect of the muscle the muscle was retracted posteriorly and protected throughout the case. I then used electrocautery to incise the tensor fascia lata fascia control and all bleeders. Immediately visible was the fat over top of the anterior neck and capsule.  I removed the anterior fat from the capsule and elevated the rectus muscle off of the anterior capsule. I then removed a large time of capsule. The retractors were then placed over the anterior acetabulum as well as around the superior and inferior neck.  I then made a femoral neck cut. Then used the power corkscrew to remove the femoral head from the acetabulum and thoroughly irrigated the acetabulum. I sized the femoral head.    I then exposed the deep acetabulum, cleared out any tissue including the ligamentum teres.   After adequate visualization, I excised the labrum, and then sequentially reamed.  I then impacted the acetabular implant into place using fluoroscopy for guidance.  Appropriate version and inclination was confirmed clinically matching their bony anatomy, and with fluoroscopy.  I placed a 20 mm screw in the posterior/superio position with an excellent bite.    I then placed the polyethylene liner in  place  I then adducted the leg and released the external rotators from the posterior femur allowing it to be easily delivered up lateral and anterior to the acetabulum for preparation of the femoral canal.    I then prepared the proximal femur using the cookie-cutter and then sequentially reamed and broached.  A trial broach, neck, and head was utilized, and I reduced the hip and used  floroscopy to assess the neck length and femoral implant.  I then impacted the femoral prosthesis into place into the appropriate version. The hip was then reduced and fluoroscopy confirmed appropriate position. Leg lengths were restored.  I then irrigated the hip copiously again with, and repaired the fascia with Vicryl, followed by monocryl for the subcutaneous tissue, Monocryl for the skin, Steri-Strips and sterile gauze. The patient was then awakened and returned to PACU in stable and satisfactory condition. There were no complications.  POST OPERATIVE PLAN: WBAT, DVT px: SCD's/TED, ambulation and chemical dvt px  Kayla Lynch, MD Orthopedic Surgeon [phone]

## 2018-08-29 NOTE — Anesthesia Procedure Notes (Addendum)
Spinal  Patient location during procedure: OR Start time: 08/29/2018 10:01 AM End time: 08/29/2018 10:07 AM Reason for block: at surgeon's request Staffing Resident/CRNA: Anne Fu, CRNA Performed: resident/CRNA  Preanesthetic Checklist Completed: patient identified, site marked, surgical consent, pre-op evaluation, timeout performed, IV checked, risks and benefits discussed and monitors and equipment checked Spinal Block Patient position: sitting Prep: DuraPrep Patient monitoring: heart rate, continuous pulse ox and blood pressure Approach: midline Location: L2-3 Injection technique: single-shot Needle Needle type: Pencan  Needle gauge: 24 G Needle length: 9 cm Assessment Sensory level: T6 Additional Notes  Functioning IV was confirmed and monitors were applied. Expiration date of kit checked and confirmed. Sterile prep and drape, including hand hygiene and sterile gloves were used. The patient was positioned and the spine was prepped. The skin was anesthetized with lidocaine.  Free flow of clear CSF was obtained prior to injecting local anesthetic into the CSF X 1 attempt.  The spinal needle aspirated freely following injection.  The needle was carefully withdrawn. Patient tolerated procedure well, without complications. Loss of motor and sensory on exam post injection.

## 2018-08-29 NOTE — Anesthesia Postprocedure Evaluation (Signed)
Anesthesia Post Note  Patient: Arisbeth Purrington  Procedure(s) Performed: TOTAL HIP ARTHROPLASTY ANTERIOR APPROACH (Left )     Patient location during evaluation: PACU Anesthesia Type: Spinal and Regional Level of consciousness: oriented and awake and alert Pain management: pain level controlled Vital Signs Assessment: post-procedure vital signs reviewed and stable Respiratory status: spontaneous breathing, respiratory function stable and patient connected to nasal cannula oxygen Cardiovascular status: blood pressure returned to baseline and stable Postop Assessment: no headache, no backache and no apparent nausea or vomiting Anesthetic complications: no    Last Vitals:  Vitals:   08/29/18 1317 08/29/18 1436  BP: 112/75 117/68  Pulse: 62 60  Resp: 18 16  Temp:  36.6 C  SpO2: 98% 95%    Last Pain:  Vitals:   08/29/18 1436  TempSrc: Oral  PainSc:                  Chelsey L Woodrum

## 2018-08-29 NOTE — Transfer of Care (Signed)
Immediate Anesthesia Transfer of Care Note  Patient: Kayla Howard  Procedure(s) Performed: TOTAL HIP ARTHROPLASTY ANTERIOR APPROACH (Left )  Patient Location: PACU  Anesthesia Type:Spinal  Level of Consciousness: awake, alert  and oriented  Airway & Oxygen Therapy: Patient Spontanous Breathing and Patient connected to face mask oxygen  Post-op Assessment: Report given to RN, Post -op Vital signs reviewed and stable and Patient moving all extremities  Post vital signs: Reviewed and stable  Last Vitals:  Vitals Value Taken Time  BP 90/79 08/29/18 1154  Temp    Pulse 76 08/29/18 1157  Resp 14 08/29/18 1157  SpO2 100 % 08/29/18 1157  Vitals shown include unvalidated device data.  Last Pain:  Vitals:   08/29/18 0755  TempSrc: Oral  PainSc:       Patients Stated Pain Goal: 5 (16/10/96 0454)  Complications: No apparent anesthesia complications

## 2018-08-29 NOTE — Plan of Care (Signed)
Plan of care 

## 2018-08-30 ENCOUNTER — Encounter (HOSPITAL_COMMUNITY): Payer: Self-pay | Admitting: Orthopedic Surgery

## 2018-08-30 DIAGNOSIS — G8929 Other chronic pain: Secondary | ICD-10-CM | POA: Diagnosis not present

## 2018-08-30 DIAGNOSIS — Z9682 Presence of neurostimulator: Secondary | ICD-10-CM | POA: Diagnosis not present

## 2018-08-30 DIAGNOSIS — F4310 Post-traumatic stress disorder, unspecified: Secondary | ICD-10-CM | POA: Diagnosis not present

## 2018-08-30 DIAGNOSIS — M1612 Unilateral primary osteoarthritis, left hip: Secondary | ICD-10-CM | POA: Diagnosis not present

## 2018-08-30 DIAGNOSIS — F419 Anxiety disorder, unspecified: Secondary | ICD-10-CM | POA: Diagnosis not present

## 2018-08-30 DIAGNOSIS — F329 Major depressive disorder, single episode, unspecified: Secondary | ICD-10-CM | POA: Diagnosis not present

## 2018-08-30 MED ORDER — LACTATED RINGERS IV BOLUS: 500.0000 mL | Freq: Once | INTRAVENOUS | Status: DC

## 2018-08-30 NOTE — Progress Notes (Signed)
Physical Therapy Treatment Patient Details Name: Kayla OdorSandra Howard MRN: [id number] DOB: 03/02/1965 Today's Date: 08/30/2018    History of Present Illness 53 yo female s/p L DA-THA on 08/29/18. PMH includes anxiety, chronic back pain, depression, PTSD, back surgery with spinal cord stimulator, R THA 2019.    PT Comments    Progressing well with mobility. Reviewed/practiced exercises, gait training, and stair training. All education completed. Okay to d/c from PT standpoint.    Follow Up Recommendations  Supervision for mobility/OOB;Follow surgeon's recommendation for DC plan and follow-up therapies     Equipment Recommendations  None recommended by PT    Recommendations for Other Services       Precautions / Restrictions Precautions Precautions: Fall Restrictions Weight Bearing Restrictions: No Other Position/Activity Restrictions: WBAT    Mobility  Bed Mobility Overal bed mobility: Needs Assistance Bed Mobility: Supine to Sit     Supine to sit: Min guard;HOB elevated     General bed mobility comments: Min guard for safety, verbal cuing for sequencing. Pt used UE to assist L LE off bed.  Transfers Overall transfer level: Needs assistance Equipment used: Rolling walker (2 wheeled) Transfers: Sit to/from Stand Sit to Stand: Min guard;From elevated surface         General transfer comment: min guard for safety. Verbal cuing for hand placement.  Ambulation/Gait Ambulation/Gait assistance: Min guard Gait Distance (Feet): 75 Feet Assistive device: Rolling walker (2 wheeled) Gait Pattern/deviations: Step-to pattern;Step-through pattern;Decreased stride length         Stairs Stairs: Yes Stairs assistance: Min guard Stair Management: Step to pattern;Forwards;Two rails Number of Stairs: 3 General stair comments: up and over portable steps. VCs safety, sequence.   Wheelchair Mobility    Modified Rankin (Stroke Patients Only)       Balance Overall balance  assessment: Mild deficits observed, not formally tested                                          Cognition Arousal/Alertness: Awake/alert Behavior During Therapy: WFL for tasks assessed/performed Overall Cognitive Status: Within Functional Limits for tasks assessed                                        Exercises Total Joint Exercises Ankle Circles/Pumps: AROM;Both;10 reps;Supine Quad Sets: AROM;Both;10 reps;Supine Heel Slides: AAROM;Left;10 reps;Supine Hip ABduction/ADduction: AAROM;Left;10 reps;Supine    General Comments        Pertinent Vitals/Pain Pain Assessment: 0-10 Pain Score: 5  Pain Location: L hip Pain Descriptors / Indicators: Sore Pain Intervention(s): Monitored during session;Ice applied    Home Living                      Prior Function            PT Goals (current goals can now be found in the care plan section) Progress towards PT goals: Progressing toward goals    Frequency    7X/week      PT Plan Current plan remains appropriate    Co-evaluation              AM-PAC PT "6 Clicks" Mobility   Outcome Measure  Help needed turning from your back to your side while in a flat bed without using bedrails?: A Little Help needed moving from  lying on your back to sitting on the side of a flat bed without using bedrails?: A Little Help needed moving to and from a bed to a chair (including a wheelchair)?: A Little Help needed standing up from a chair using your arms (e.g., wheelchair or bedside chair)?: A Little Help needed to walk in hospital room?: A Little Help needed climbing 3-5 steps with a railing? : A Little 6 Click Score: 18    End of Session Equipment Utilized During Treatment: Gait belt Activity Tolerance: Patient tolerated treatment well Patient left: in chair;with call bell/phone within reach   PT Visit Diagnosis: Other abnormalities of gait and mobility (R26.89);Difficulty in walking,  not elsewhere classified (R26.2)     Time: 4259-5638 PT Time Calculation (min) (ACUTE ONLY): 16 min  Charges:  $Gait Training: 8-22 mins                        Weston Anna, PT Acute Rehabilitation Services Pager: [phone] Office: [phone]

## 2018-08-30 NOTE — TOC Transition Note (Signed)
Transition of Care [facility]) - CM/SW Discharge Note   Patient Details  Name: Kayla Howard MRN: [id number] Date of Birth: August 09, 1965  [facility] of Care [facility] CM/SW Contact:  Leeroy Cha, RN Phone Number: 08/30/2018, 10:10 AM   Clinical Narrative:    dcd to home with oopt   Final next level of care: OP Rehab Barriers to Discharge: No Barriers Identified   Patient Goals and CMS Choice Patient states their goals for this hospitalization and ongoing recovery are:: to do the therapy CMS Medicare.gov Compare Post Acute Care list provided to:: Patient Choice offered to / list presented to : Patient  Discharge Placement                       Discharge Plan and Services   Discharge Planning Services: CM Consult Post Acute Care Choice: Durable Medical Equipment          DME Arranged: Gilford Rile rolling, 3-N-1 DME Agency: [employer] Date DME Agency Contacted: 08/30/18 Time DME Agency Contacted: 0900 Representative spoke with at DME Agency: nathan            Social Determinants of Health ([place]) Interventions     Readmission Risk Interventions No flowsheet data found.

## 2018-08-30 NOTE — Discharge Summary (Signed)
Discharge Summary  Patient ID: Kayla Howard MRN: [id number] DOB/AGE: 51-Sep-1967 51 y.o.  Admit date: 08/29/2018 Discharge date: 08/30/2018  Admission Diagnoses:  Primary osteoarthritis of left hip  Discharge Diagnoses:  Principal Problem:   Primary osteoarthritis of left hip Active Problems:   Anxiety   Chronic back pain   PTSD (post-traumatic stress disorder)   Primary localized osteoarthritis of hip   Past Medical History:  Diagnosis Date  . Anxiety    takes Valium nightly  . Chronic back pain    several back surgeries  . Complication of anesthesia    gets very anxious after anesthesia  . Depression   . History of bronchitis 2005  . History of migraine    last one 6 months ago  . Insomnia    takes Trazodone nightly  . Pneumonia    hx of 2005  . PTSD (post-traumatic stress disorder)    takes Lamictal,Minipress,and Latuda nightly    Surgeries: Procedure(s): TOTAL HIP ARTHROPLASTY ANTERIOR APPROACH on 08/29/2018   Consultants (if any):   Discharged Condition: Improved  Hospital Course: Kayla Howard is an 53 y.o. female who was admitted 08/29/2018 with a diagnosis of Primary osteoarthritis of left hip and went to the operating room on 08/29/2018 and underwent the above named procedures.    She was given perioperative antibiotics:  Anti-infectives (From admission, onward)   Start     Dose/Rate Route Frequency Ordered Stop   08/29/18 1600  ceFAZolin (ANCEF) IVPB 1 g/50 mL premix     1 g 100 mL/hr over 30 Minutes Intravenous Every 6 hours 08/29/18 1325 08/29/18 2229   08/29/18 0745  ceFAZolin (ANCEF) IVPB 2g/100 mL premix     2 g 200 mL/hr over 30 Minutes Intravenous On call to O.R. 08/29/18 5053 08/29/18 1010    .  She was given sequential compression devices, early ambulation, and ASA for DVT prophylaxis.  She benefited maximally from the hospital stay and there were no complications.    Recent vital signs:  Vitals:   08/30/18 0532 08/30/18 0625  BP: (!)  82/51 (!) 83/59  Pulse: 67   Resp: 16   Temp: 98.1 F (36.7 C)   SpO2: 98%     Recent laboratory studies:  Lab Results  Component Value Date   HGB 14.1 08/18/2018   HGB 12.5 07/22/2017   HGB 11.7 (L) 06/11/2016   Lab Results  Component Value Date   WBC 9.1 08/18/2018   PLT 271 08/18/2018   No results found for: INR Lab Results  Component Value Date   NA 138 08/18/2018   K 3.7 08/18/2018   CL 100 08/18/2018   CO2 26 08/18/2018   BUN 17 08/18/2018   CREATININE 0.76 08/18/2018   GLUCOSE 97 08/18/2018    Discharge Medications:   Allergies as of 08/30/2018      Reactions   Ambien [zolpidem Tartrate] Other (See Comments)   Disorientation/anxiety   Morphine And Related Nausea And Vomiting, Other (See Comments)   Migraines/clammy   Flexeril [cyclobenzaprine] Itching   Severe itching      Medication List    TAKE these medications   acetaminophen 500 MG tablet Commonly known as: TYLENOL Take 2 tablets (1,000 mg total) by mouth every 8 (eight) hours for 10 days. For Pain.   aspirin EC 81 MG tablet Take 1 tablet (81 mg total) by mouth 2 (two) times daily. For DVT prophylaxis for 30 days after surgery.   BIOTIN PO Take 1 tablet by mouth at  bedtime.   COLLAGEN PO Take 1 capsule by mouth at bedtime.   diazepam 10 MG tablet Commonly known as: VALIUM Take 10 mg by mouth at bedtime.   docusate sodium 100 MG capsule Commonly known as: Colace Take 1 capsule (100 mg total) by mouth 2 (two) times daily. To prevent constipation while taking pain medication.   ibuprofen 800 MG tablet Commonly known as: ADVIL Take 800 mg by mouth every 8 (eight) hours as needed for moderate pain.   lamoTRIgine 200 MG tablet Commonly known as: LAMICTAL Take 400 mg by mouth at bedtime.   Latuda 60 MG Tabs Generic drug: Lurasidone HCl Take 60 mg by mouth at bedtime.   ondansetron 4 MG tablet Commonly known as: Zofran Take 1 tablet (4 mg total) by mouth every 8 (eight) hours as  needed for nausea or vomiting.   oxyCODONE 5 MG immediate release tablet Commonly known as: Roxicodone Take 1 tablet (5 mg total) by mouth every 4 (four) hours as needed for breakthrough pain.   prazosin 2 MG capsule Commonly known as: MINIPRESS Take 2 mg by mouth at bedtime.   traZODone 150 MG tablet Commonly known as: DESYREL Take 300 mg by mouth at bedtime.       Diagnostic Studies: Dg C-arm 1-60 Min-no Report  Result Date: 08/29/2018 Fluoroscopy was utilized by the requesting physician.  No radiographic interpretation.   Dg Hip Operative Unilat W Or W/o Pelvis Left  Result Date: 08/29/2018 CLINICAL DATA:  Intraoperative imaging for left hip arthroplasty. EXAM: OPERATIVE LEFT HIP (WITH PELVIS IF PERFORMED) 6 VIEWS TECHNIQUE: Fluoroscopic spot image(s) were submitted for interpretation post-operatively. COMPARISON:  None. FINDINGS: Submitted images demonstrate placement of femoral and acetabular components of a total left hip arthroplasty. The components appear well seated and aligned. No acute fracture or evidence of an operative complication. IMPRESSION: Operative imaging for left total hip arthroplasty. Arthroplasty appears well positioned. Electronically Signed   By: Amie Portlandavid  Ormond M.D.   On: 08/29/2018 13:54    Disposition: Discharge disposition: 01-Home or Self Care       Discharge Instructions    Discharge patient   Complete by: As directed    Discharge disposition: 01-Home or Self Care   Discharge patient date: 08/30/2018      Follow-up Information    Sheral ApleyMurphy, Timothy D, MD. Go on 09/13/2018.   Specialty: Orthopedic Surgery Why: Your appointment has been scheduled for 4:15.  Contact information: [address] [place] [address] [phone]        Practice, [facility]. Go on 08/31/2018.   Specialty: Physical Therapy Why:**This is [facility] ** You are scheduled to start outpatient physical therapy at  10:00. Please arrive a few minutes early to complete your paperwork  Contact information: [address] [place] [address] [phone]        Sheral ApleyMurphy, Timothy D, MD.   Specialty: Orthopedic Surgery Contact information: [address] [place] [address] [phone]            Signed: Albina BilletHenry Calvin Martensen III PA-C 08/30/2018, 7:33 AM

## 2018-08-30 NOTE — Plan of Care (Signed)
compleated 

## 2018-08-30 NOTE — Plan of Care (Signed)
  Problem: Clinical Measurements: Goal: Diagnostic test results will improve Outcome: Progressing   Problem: Activity: Goal: Risk for activity intolerance will decrease Outcome: Progressing   Problem: Coping: Goal: Level of anxiety will decrease Outcome: Progressing   Problem: Pain Managment: Goal: General experience of comfort will improve Outcome: Progressing   Problem: Pain Management: Goal: Pain level will decrease with appropriate interventions Outcome: Progressing

## 2018-08-30 NOTE — Progress Notes (Signed)
    Subjective: Patient reports pain as mild, controlled.  Tolerating diet.  Urinating.  No CP, SOB.  No lightheadedness or dizziness.  Good early mobilization with therapy.  Feels ready to go home today.    Objective:   VITALS:   Vitals:   08/29/18 2122 08/30/18 0054 08/30/18 0532 08/30/18 0625  BP: 114/73 110/61 (!) 82/51 (!) 83/59  Pulse: 66 67 67   Resp: 16 16 16    Temp: 98.1 F (36.7 C) 97.7 F (36.5 C) 98.1 F (36.7 C)   TempSrc: Oral Oral Oral   SpO2: 97% 99% 98%   Weight:      Height:       CBC Latest Ref Rng & Units 08/18/2018 07/22/2017 06/11/2016  WBC 4.0 - 10.5 K/uL 9.1 8.6 5.9  Hemoglobin 12.0 - 15.0 g/dL 14.1 12.5 11.7(L)  Hematocrit 36.0 - 46.0 % 43.0 37.6 35.2(L)  Platelets 150 - 400 K/uL 271 246 268   BMP Latest Ref Rng & Units 08/18/2018 07/22/2017 06/11/2016  Glucose 70 - 99 mg/dL 97 100(H) 99  BUN 6 - 20 mg/dL 17 11 5(L)  Creatinine 0.44 - 1.00 mg/dL 0.76 0.74 0.70  Sodium 135 - 145 mmol/L 138 140 138  Potassium 3.5 - 5.1 mmol/L 3.7 3.7 3.8  Chloride 98 - 111 mmol/L 100 105 104  CO2 22 - 32 mmol/L 26 26 27   Calcium 8.9 - 10.3 mg/dL 9.3 9.1 9.1   Intake/Output      07/28 0701 - 07/29 0700 07/29 0701 - 07/30 0700   P.O. 650    I.V. (mL/kg) 3363.3 (45)    IV Piggyback 250    Total Intake(mL/kg) 4263.3 (57)    Urine (mL/kg/hr) 3100    Blood 500    Total Output 3600    Net +663.3            Physical Exam: General: NAD.  Upright in bed.  Calm, conversant. Resp: No increased wob Cardio: regular rate and rhythm ABD soft Neurologically intact MSK LLE Neurovascularly intact Sensation intact distally Feet warm Dorsiflexion/Plantar flexion intact Incision: dressing C/D/I   Assessment: 1 Day Post-Op  S/P Procedure(s) (LRB): TOTAL HIP ARTHROPLASTY ANTERIOR APPROACH (Left) by Dr. Ernesta Amble. Percell Miller on 08/29/2018  Principal Problem:   Primary osteoarthritis of left hip Active Problems:   Anxiety   Chronic back pain   PTSD (post-traumatic  stress disorder)   Primary localized osteoarthritis of hip   Primary osteoarthritis, status post total hip arthroplasty Doing well postop day 1 Tolerating diet and voiding Pain controlled Good early mobilization BP soft this a.m. after pain medicine administration.  Patient states this is normal for her and denies any symptoms of hypotension.  Plan: Up with therapy Incentive Spirometry Apply ice LR bolus for soft BP.  Okay to DC fluids after this if BP improves.  Weight Bearing: Weight Bearing as Tolerated (WBAT)  Dressings: Maintain Mepilex.   VTE prophylaxis: Aspirin, SCDs, ambulation Dispo: Home today after a.m. therapy.   Charna Elizabeth Martensen III, PA-C 08/30/2018, 7:27 AM

## 2018-08-31 DIAGNOSIS — M25552 Pain in left hip: Secondary | ICD-10-CM | POA: Diagnosis not present

## 2018-08-31 DIAGNOSIS — R262 Difficulty in walking, not elsewhere classified: Secondary | ICD-10-CM | POA: Diagnosis not present

## 2018-09-05 DIAGNOSIS — R262 Difficulty in walking, not elsewhere classified: Secondary | ICD-10-CM | POA: Diagnosis not present

## 2018-09-05 DIAGNOSIS — M25552 Pain in left hip: Secondary | ICD-10-CM | POA: Diagnosis not present

## 2018-09-07 DIAGNOSIS — R262 Difficulty in walking, not elsewhere classified: Secondary | ICD-10-CM | POA: Diagnosis not present

## 2018-09-07 DIAGNOSIS — M25552 Pain in left hip: Secondary | ICD-10-CM | POA: Diagnosis not present

## 2018-09-11 DIAGNOSIS — F4310 Post-traumatic stress disorder, unspecified: Secondary | ICD-10-CM | POA: Diagnosis not present

## 2018-09-11 DIAGNOSIS — F321 Major depressive disorder, single episode, moderate: Secondary | ICD-10-CM | POA: Diagnosis not present

## 2018-09-11 DIAGNOSIS — F411 Generalized anxiety disorder: Secondary | ICD-10-CM | POA: Diagnosis not present

## 2018-09-12 DIAGNOSIS — R262 Difficulty in walking, not elsewhere classified: Secondary | ICD-10-CM | POA: Diagnosis not present

## 2018-09-12 DIAGNOSIS — M25552 Pain in left hip: Secondary | ICD-10-CM | POA: Diagnosis not present

## 2018-09-13 DIAGNOSIS — M1612 Unilateral primary osteoarthritis, left hip: Secondary | ICD-10-CM | POA: Diagnosis not present

## 2018-09-14 DIAGNOSIS — R262 Difficulty in walking, not elsewhere classified: Secondary | ICD-10-CM | POA: Diagnosis not present

## 2018-09-14 DIAGNOSIS — M25552 Pain in left hip: Secondary | ICD-10-CM | POA: Diagnosis not present

## 2018-09-19 DIAGNOSIS — R262 Difficulty in walking, not elsewhere classified: Secondary | ICD-10-CM | POA: Diagnosis not present

## 2018-09-19 DIAGNOSIS — M25552 Pain in left hip: Secondary | ICD-10-CM | POA: Diagnosis not present

## 2018-09-21 DIAGNOSIS — M25552 Pain in left hip: Secondary | ICD-10-CM | POA: Diagnosis not present

## 2018-09-21 DIAGNOSIS — R262 Difficulty in walking, not elsewhere classified: Secondary | ICD-10-CM | POA: Diagnosis not present

## 2018-09-26 DIAGNOSIS — M25552 Pain in left hip: Secondary | ICD-10-CM | POA: Diagnosis not present

## 2018-09-26 DIAGNOSIS — R262 Difficulty in walking, not elsewhere classified: Secondary | ICD-10-CM | POA: Diagnosis not present

## 2018-09-28 DIAGNOSIS — M25552 Pain in left hip: Secondary | ICD-10-CM | POA: Diagnosis not present

## 2018-09-28 DIAGNOSIS — R262 Difficulty in walking, not elsewhere classified: Secondary | ICD-10-CM | POA: Diagnosis not present

## 2018-10-03 DIAGNOSIS — M25552 Pain in left hip: Secondary | ICD-10-CM | POA: Diagnosis not present

## 2018-10-03 DIAGNOSIS — R262 Difficulty in walking, not elsewhere classified: Secondary | ICD-10-CM | POA: Diagnosis not present

## 2018-10-04 DIAGNOSIS — M1612 Unilateral primary osteoarthritis, left hip: Secondary | ICD-10-CM | POA: Diagnosis not present

## 2018-10-05 DIAGNOSIS — Z6822 Body mass index (BMI) 22.0-22.9, adult: Secondary | ICD-10-CM | POA: Diagnosis not present

## 2018-10-05 DIAGNOSIS — F39 Unspecified mood [affective] disorder: Secondary | ICD-10-CM | POA: Diagnosis not present

## 2018-10-05 DIAGNOSIS — Z299 Encounter for prophylactic measures, unspecified: Secondary | ICD-10-CM | POA: Diagnosis not present

## 2018-10-05 DIAGNOSIS — Z1339 Encounter for screening examination for other mental health and behavioral disorders: Secondary | ICD-10-CM | POA: Diagnosis not present

## 2018-10-05 DIAGNOSIS — M25552 Pain in left hip: Secondary | ICD-10-CM | POA: Diagnosis not present

## 2018-10-05 DIAGNOSIS — Z1331 Encounter for screening for depression: Secondary | ICD-10-CM | POA: Diagnosis not present

## 2018-10-05 DIAGNOSIS — R262 Difficulty in walking, not elsewhere classified: Secondary | ICD-10-CM | POA: Diagnosis not present

## 2018-10-05 DIAGNOSIS — Z0000 Encounter for general adult medical examination without abnormal findings: Secondary | ICD-10-CM | POA: Diagnosis not present

## 2018-10-05 DIAGNOSIS — F419 Anxiety disorder, unspecified: Secondary | ICD-10-CM | POA: Diagnosis not present

## 2018-10-05 DIAGNOSIS — R5383 Other fatigue: Secondary | ICD-10-CM | POA: Diagnosis not present

## 2018-10-05 DIAGNOSIS — Z7189 Other specified counseling: Secondary | ICD-10-CM | POA: Diagnosis not present

## 2018-10-06 DIAGNOSIS — R5383 Other fatigue: Secondary | ICD-10-CM | POA: Diagnosis not present

## 2018-10-06 DIAGNOSIS — Z79899 Other long term (current) drug therapy: Secondary | ICD-10-CM | POA: Diagnosis not present

## 2018-10-06 DIAGNOSIS — F419 Anxiety disorder, unspecified: Secondary | ICD-10-CM | POA: Diagnosis not present

## 2018-10-10 DIAGNOSIS — Z111 Encounter for screening for respiratory tuberculosis: Secondary | ICD-10-CM | POA: Diagnosis not present

## 2018-10-13 DIAGNOSIS — Z23 Encounter for immunization: Secondary | ICD-10-CM | POA: Diagnosis not present

## 2018-12-19 DIAGNOSIS — F4310 Post-traumatic stress disorder, unspecified: Secondary | ICD-10-CM | POA: Diagnosis not present

## 2018-12-19 DIAGNOSIS — F321 Major depressive disorder, single episode, moderate: Secondary | ICD-10-CM | POA: Diagnosis not present

## 2018-12-19 DIAGNOSIS — F411 Generalized anxiety disorder: Secondary | ICD-10-CM | POA: Diagnosis not present

## 2019-01-24 DIAGNOSIS — Z005 Encounter for examination of potential donor of organ and tissue: Secondary | ICD-10-CM | POA: Diagnosis not present

## 2019-03-12 DIAGNOSIS — M542 Cervicalgia: Secondary | ICD-10-CM | POA: Diagnosis not present

## 2019-03-12 DIAGNOSIS — M545 Low back pain: Secondary | ICD-10-CM | POA: Diagnosis not present

## 2019-03-12 DIAGNOSIS — M546 Pain in thoracic spine: Secondary | ICD-10-CM | POA: Diagnosis not present

## 2019-03-19 DIAGNOSIS — F411 Generalized anxiety disorder: Secondary | ICD-10-CM | POA: Diagnosis not present

## 2019-03-19 DIAGNOSIS — F4310 Post-traumatic stress disorder, unspecified: Secondary | ICD-10-CM | POA: Diagnosis not present

## 2019-03-19 DIAGNOSIS — F321 Major depressive disorder, single episode, moderate: Secondary | ICD-10-CM | POA: Diagnosis not present

## 2019-06-15 DIAGNOSIS — F411 Generalized anxiety disorder: Secondary | ICD-10-CM | POA: Diagnosis not present

## 2019-06-15 DIAGNOSIS — F321 Major depressive disorder, single episode, moderate: Secondary | ICD-10-CM | POA: Diagnosis not present

## 2019-06-15 DIAGNOSIS — F4310 Post-traumatic stress disorder, unspecified: Secondary | ICD-10-CM | POA: Diagnosis not present

## 2019-08-01 DIAGNOSIS — G894 Chronic pain syndrome: Secondary | ICD-10-CM | POA: Diagnosis not present

## 2019-09-08 DIAGNOSIS — Z23 Encounter for immunization: Secondary | ICD-10-CM | POA: Diagnosis not present

## 2019-09-19 DIAGNOSIS — F321 Major depressive disorder, single episode, moderate: Secondary | ICD-10-CM | POA: Diagnosis not present

## 2019-09-19 DIAGNOSIS — F4310 Post-traumatic stress disorder, unspecified: Secondary | ICD-10-CM | POA: Diagnosis not present

## 2019-09-19 DIAGNOSIS — F411 Generalized anxiety disorder: Secondary | ICD-10-CM | POA: Diagnosis not present

## 2019-10-22 DIAGNOSIS — Z79899 Other long term (current) drug therapy: Secondary | ICD-10-CM | POA: Diagnosis not present

## 2019-10-22 DIAGNOSIS — Z299 Encounter for prophylactic measures, unspecified: Secondary | ICD-10-CM | POA: Diagnosis not present

## 2019-10-22 DIAGNOSIS — Z1339 Encounter for screening examination for other mental health and behavioral disorders: Secondary | ICD-10-CM | POA: Diagnosis not present

## 2019-10-22 DIAGNOSIS — Z1331 Encounter for screening for depression: Secondary | ICD-10-CM | POA: Diagnosis not present

## 2019-10-22 DIAGNOSIS — Z0000 Encounter for general adult medical examination without abnormal findings: Secondary | ICD-10-CM | POA: Diagnosis not present

## 2019-10-22 DIAGNOSIS — R5383 Other fatigue: Secondary | ICD-10-CM | POA: Diagnosis not present

## 2019-10-22 DIAGNOSIS — F39 Unspecified mood [affective] disorder: Secondary | ICD-10-CM | POA: Diagnosis not present

## 2019-10-22 DIAGNOSIS — Z7189 Other specified counseling: Secondary | ICD-10-CM | POA: Diagnosis not present

## 2019-11-10 DIAGNOSIS — Z23 Encounter for immunization: Secondary | ICD-10-CM | POA: Diagnosis not present

## 2019-12-14 DIAGNOSIS — F4310 Post-traumatic stress disorder, unspecified: Secondary | ICD-10-CM | POA: Diagnosis not present

## 2019-12-14 DIAGNOSIS — F321 Major depressive disorder, single episode, moderate: Secondary | ICD-10-CM | POA: Diagnosis not present

## 2019-12-14 DIAGNOSIS — F411 Generalized anxiety disorder: Secondary | ICD-10-CM | POA: Diagnosis not present

## 2020-03-07 DIAGNOSIS — F4310 Post-traumatic stress disorder, unspecified: Secondary | ICD-10-CM | POA: Diagnosis not present

## 2020-03-07 DIAGNOSIS — F411 Generalized anxiety disorder: Secondary | ICD-10-CM | POA: Diagnosis not present

## 2020-03-07 DIAGNOSIS — F321 Major depressive disorder, single episode, moderate: Secondary | ICD-10-CM | POA: Diagnosis not present

## 2020-03-11 DIAGNOSIS — S8992XA Unspecified injury of left lower leg, initial encounter: Secondary | ICD-10-CM | POA: Diagnosis not present

## 2020-03-11 DIAGNOSIS — G8911 Acute pain due to trauma: Secondary | ICD-10-CM | POA: Diagnosis not present

## 2020-03-11 DIAGNOSIS — S79912A Unspecified injury of left hip, initial encounter: Secondary | ICD-10-CM | POA: Diagnosis not present

## 2020-03-21 DIAGNOSIS — M1712 Unilateral primary osteoarthritis, left knee: Secondary | ICD-10-CM | POA: Diagnosis not present

## 2020-04-25 DIAGNOSIS — M1712 Unilateral primary osteoarthritis, left knee: Secondary | ICD-10-CM | POA: Diagnosis not present

## 2020-05-23 ENCOUNTER — Other Ambulatory Visit: Payer: Self-pay | Admitting: Orthopedic Surgery

## 2020-05-23 DIAGNOSIS — M25562 Pain in left knee: Secondary | ICD-10-CM

## 2020-05-23 DIAGNOSIS — M1712 Unilateral primary osteoarthritis, left knee: Secondary | ICD-10-CM | POA: Diagnosis not present

## 2020-05-30 DIAGNOSIS — F321 Major depressive disorder, single episode, moderate: Secondary | ICD-10-CM | POA: Diagnosis not present

## 2020-05-30 DIAGNOSIS — F411 Generalized anxiety disorder: Secondary | ICD-10-CM | POA: Diagnosis not present

## 2020-05-30 DIAGNOSIS — F4310 Post-traumatic stress disorder, unspecified: Secondary | ICD-10-CM | POA: Diagnosis not present

## 2020-06-11 ENCOUNTER — Other Ambulatory Visit: Payer: Medicare Other

## 2020-06-11 ENCOUNTER — Inpatient Hospital Stay: Admission: RE | Admit: 2020-06-11 | Payer: Medicare Other | Source: Ambulatory Visit

## 2020-06-24 ENCOUNTER — Inpatient Hospital Stay: Admission: RE | Admit: 2020-06-24 | Payer: Medicare Other | Source: Ambulatory Visit

## 2020-08-14 DIAGNOSIS — H6692 Otitis media, unspecified, left ear: Secondary | ICD-10-CM | POA: Diagnosis not present

## 2020-08-14 DIAGNOSIS — Z299 Encounter for prophylactic measures, unspecified: Secondary | ICD-10-CM | POA: Diagnosis not present

## 2020-08-14 DIAGNOSIS — Z87891 Personal history of nicotine dependence: Secondary | ICD-10-CM | POA: Diagnosis not present

## 2020-08-22 DIAGNOSIS — F411 Generalized anxiety disorder: Secondary | ICD-10-CM | POA: Diagnosis not present

## 2020-08-22 DIAGNOSIS — F321 Major depressive disorder, single episode, moderate: Secondary | ICD-10-CM | POA: Diagnosis not present

## 2020-08-22 DIAGNOSIS — F4310 Post-traumatic stress disorder, unspecified: Secondary | ICD-10-CM | POA: Diagnosis not present

## 2020-08-29 DIAGNOSIS — H9192 Unspecified hearing loss, left ear: Secondary | ICD-10-CM | POA: Diagnosis not present

## 2020-08-29 DIAGNOSIS — Z299 Encounter for prophylactic measures, unspecified: Secondary | ICD-10-CM | POA: Diagnosis not present

## 2020-08-29 DIAGNOSIS — H6692 Otitis media, unspecified, left ear: Secondary | ICD-10-CM | POA: Diagnosis not present

## 2020-10-30 DIAGNOSIS — Z79899 Other long term (current) drug therapy: Secondary | ICD-10-CM | POA: Diagnosis not present

## 2020-10-30 DIAGNOSIS — Z0000 Encounter for general adult medical examination without abnormal findings: Secondary | ICD-10-CM | POA: Diagnosis not present

## 2020-10-30 DIAGNOSIS — Z6824 Body mass index (BMI) 24.0-24.9, adult: Secondary | ICD-10-CM | POA: Diagnosis not present

## 2020-10-30 DIAGNOSIS — Z7189 Other specified counseling: Secondary | ICD-10-CM | POA: Diagnosis not present

## 2020-10-30 DIAGNOSIS — Z1339 Encounter for screening examination for other mental health and behavioral disorders: Secondary | ICD-10-CM | POA: Diagnosis not present

## 2020-10-30 DIAGNOSIS — R5383 Other fatigue: Secondary | ICD-10-CM | POA: Diagnosis not present

## 2020-10-30 DIAGNOSIS — Z1331 Encounter for screening for depression: Secondary | ICD-10-CM | POA: Diagnosis not present

## 2020-10-30 DIAGNOSIS — Z23 Encounter for immunization: Secondary | ICD-10-CM | POA: Diagnosis not present

## 2020-10-30 DIAGNOSIS — E7800 Pure hypercholesterolemia, unspecified: Secondary | ICD-10-CM | POA: Diagnosis not present

## 2020-10-30 DIAGNOSIS — Z299 Encounter for prophylactic measures, unspecified: Secondary | ICD-10-CM | POA: Diagnosis not present

## 2020-10-31 DIAGNOSIS — R5383 Other fatigue: Secondary | ICD-10-CM | POA: Diagnosis not present

## 2020-10-31 DIAGNOSIS — E7800 Pure hypercholesterolemia, unspecified: Secondary | ICD-10-CM | POA: Diagnosis not present

## 2020-10-31 DIAGNOSIS — Z79899 Other long term (current) drug therapy: Secondary | ICD-10-CM | POA: Diagnosis not present

## 2020-11-12 DIAGNOSIS — H90A32 Mixed conductive and sensorineural hearing loss, unilateral, left ear with restricted hearing on the contralateral side: Secondary | ICD-10-CM | POA: Diagnosis not present

## 2020-11-12 DIAGNOSIS — T50995S Adverse effect of other drugs, medicaments and biological substances, sequela: Secondary | ICD-10-CM | POA: Diagnosis not present

## 2020-11-12 DIAGNOSIS — H90A21 Sensorineural hearing loss, unilateral, right ear, with restricted hearing on the contralateral side: Secondary | ICD-10-CM | POA: Diagnosis not present

## 2020-11-12 DIAGNOSIS — F17200 Nicotine dependence, unspecified, uncomplicated: Secondary | ICD-10-CM | POA: Diagnosis not present

## 2020-11-14 DIAGNOSIS — F4310 Post-traumatic stress disorder, unspecified: Secondary | ICD-10-CM | POA: Diagnosis not present

## 2020-11-14 DIAGNOSIS — F411 Generalized anxiety disorder: Secondary | ICD-10-CM | POA: Diagnosis not present

## 2020-11-14 DIAGNOSIS — F321 Major depressive disorder, single episode, moderate: Secondary | ICD-10-CM | POA: Diagnosis not present

## 2020-11-29 DIAGNOSIS — Z1212 Encounter for screening for malignant neoplasm of rectum: Secondary | ICD-10-CM | POA: Diagnosis not present

## 2020-11-29 DIAGNOSIS — Z1211 Encounter for screening for malignant neoplasm of colon: Secondary | ICD-10-CM | POA: Diagnosis not present

## 2020-12-07 LAB — COLOGUARD: COLOGUARD: NEGATIVE

## 2020-12-07 LAB — EXTERNAL GENERIC LAB PROCEDURE: COLOGUARD: NEGATIVE

## 2021-05-12 ENCOUNTER — Other Ambulatory Visit: Payer: Self-pay | Admitting: Orthopedic Surgery

## 2021-05-12 ENCOUNTER — Other Ambulatory Visit: Payer: Medicare Other

## 2021-05-12 ENCOUNTER — Inpatient Hospital Stay: Admission: RE | Admit: 2021-05-12 | Payer: Medicare Other | Source: Ambulatory Visit

## 2021-05-12 ENCOUNTER — Other Ambulatory Visit (HOSPITAL_COMMUNITY): Payer: Self-pay | Admitting: Orthopedic Surgery

## 2021-05-21 ENCOUNTER — Other Ambulatory Visit (HOSPITAL_COMMUNITY): Payer: Self-pay | Admitting: Orthopedic Surgery

## 2021-05-21 DIAGNOSIS — M25562 Pain in left knee: Secondary | ICD-10-CM

## 2021-06-03 ENCOUNTER — Ambulatory Visit (HOSPITAL_COMMUNITY)
Admission: RE | Admit: 2021-06-03 | Discharge: 2021-06-03 | Disposition: A | Discharge status: Home / Self Care | Payer: Medicare (Managed Care) | Source: Ambulatory Visit | Attending: Orthopedic Surgery | Admitting: Orthopedic Surgery

## 2021-06-03 DIAGNOSIS — M25562 Pain in left knee: Secondary | ICD-10-CM

## 2021-06-03 MED ORDER — IOHEXOL 180 MG/ML  SOLN
30.0000 mL | Freq: Once | INTRAMUSCULAR | Status: AC | PRN
  Administered 2021-06-03: 30 mL via INTRA_ARTICULAR

## 2021-06-03 MED ORDER — LIDOCAINE HCL (PF) 1 % IJ SOLN
5.0000 mL | Freq: Once | INTRAMUSCULAR | Status: AC
  Administered 2021-06-03: 5 mL via INTRADERMAL

## 2021-06-03 MED ORDER — SODIUM CHLORIDE (PF) 0.9 % IJ SOLN
5.0000 mL | Freq: Once | INTRAMUSCULAR | Status: AC
  Administered 2021-06-03: 5 mL

## 2023-01-18 ENCOUNTER — Encounter: Payer: Self-pay | Admitting: Physical Medicine and Rehabilitation

## 2023-02-21 ENCOUNTER — Encounter
Payer: Medicare (Managed Care) | Attending: Physical Medicine and Rehabilitation | Admitting: Physical Medicine and Rehabilitation

## 2023-02-21 ENCOUNTER — Encounter: Payer: Self-pay | Admitting: Physical Medicine and Rehabilitation

## 2023-02-21 VITALS — BP 138/85 | HR 87 | Ht 72.0 in | Wt 148.0 lb

## 2023-02-21 DIAGNOSIS — M5440 Lumbago with sciatica, unspecified side: Secondary | ICD-10-CM | POA: Diagnosis not present

## 2023-02-21 DIAGNOSIS — M546 Pain in thoracic spine: Secondary | ICD-10-CM | POA: Diagnosis not present

## 2023-02-21 DIAGNOSIS — Z5181 Encounter for therapeutic drug level monitoring: Secondary | ICD-10-CM | POA: Diagnosis present

## 2023-02-21 DIAGNOSIS — Z79891 Long term (current) use of opiate analgesic: Secondary | ICD-10-CM | POA: Diagnosis present

## 2023-02-21 DIAGNOSIS — Z7409 Other reduced mobility: Secondary | ICD-10-CM | POA: Insufficient documentation

## 2023-02-21 DIAGNOSIS — G8929 Other chronic pain: Secondary | ICD-10-CM | POA: Insufficient documentation

## 2023-02-21 DIAGNOSIS — Z789 Other specified health status: Secondary | ICD-10-CM | POA: Insufficient documentation

## 2023-02-21 DIAGNOSIS — G894 Chronic pain syndrome: Secondary | ICD-10-CM | POA: Diagnosis not present

## 2023-02-21 NOTE — Progress Notes (Signed)
Subjective:    Patient ID: Kayla Howard, female    DOB: [birth year], 58 y.o.   MRN: [id number]  HPI HPI  Kayla Howard is a 58 y.o. year old female  who  has a past medical history of Anxiety, Chronic back pain, Complication of anesthesia, Depression, History of bronchitis (2005), History of migraine, Insomnia, Pneumonia, and PTSD (post-traumatic stress disorder).   They are presenting to PM&R clinic as a new patient for pain management evaluation. They were referred by Dr. Milana Obey for treatment of chronic low back pain s/p fusion L4-S1 with spinal cord stimulator, and T8-9 thoracic SCI (functional injury).   Source: Thoracic spine > lumbar spine Inciting incident: 15 years ago she "coughed with pneumonia and blew out my L spine". 5 years ago, thoracic spine pain "just came on".   Description of pain: "agony" 10/10, constant, aching  around her thoracic spoine Exacerbating factors: "work" - bending, lifting, twisting Remitting factors: "sonobag" - large heated sleeping bag Red flag symptoms: No red flags for back pain endorsed in Hx or ROS  Medications tried: Topical medications (no effect) : none Nsaids (no effect) : Dr. Jules Husbands prescribed celebrex - "it didn't help at all"/ Ibuprofen for migraines PRN, rarely. Toradol 10 mg has not helped.  Tylenol  (no effect) : Takes rarely Opiates  (unsure of effect) : "Medicare wont cover tramadol." Has used oxycodone post-surgically in the past without side effects. Has terrible migraines/vomitting with morphine.   Gabapentin / Lyrica  (unsure of effect) : Gabapentin "makes me loopy" and causes falls. Have not tried Lyrica.  TCAs  (never tried) :  SNRIs  (no effect) : Cymbalta prescribed by Dr. Milana Obey last visit; stopped because it didn't work after 1.5 weeks.  Other  (no effect) : Started steroids with Dr. Milana Obey but stopped them shortly after due to xray findings. Valium 10 mg at nighttime for anxiety - not helpful for pain.   Other treatments: PT/OT   (no effect) : Last in September; was in for 6 weeks  Accupuncture/chiropractor/massage  (mild effect) : Went to chiropractor in June - got an xray of her back and "I never heard from them again".   She called and never received a call back. Used to do accupuncture when she was in [place], which was helpful.   TENs unit (never tried) : She has one at home but has never used it.   Injections (moderate effect) : [facility] cervical and lumbar ESI 5 years ago; stopped because she needed hip replacements and because he stopped taking her insurance. They were helpful for 2 months at a time.   She gets toradol shots "as often as I can" for pain with Dr. Milana Obey. Takes the edge off her pain but does not last.   Surgery (moderate effect) : Spinal cord stimulator placed in 2010 - replaced with Dr. Venetia Maxon with [facility] but no longer functioning. Has [employer] contact but hasn't bothered to call because "it only works from my hips down because it was placed after my fusion".   Had L4-S1 spinal fusion 2010 in [place]  Got hip replacements 3 years ago staggered by 1 year with Dr. Eulah Pont; "that surgery was a godsend".   Goals for pain control: "I want to be able to bend and move like I'm supposed to".   Pain and sleep has been poor after her boyfriend's death recently; has noticed she will fall asleep and wake up in the same position.  She has a video appointment coming up with her psychiatrist   Prior UDS results: No results found for: "LABOPIA", "COCAINSCRNUR", "LABBENZ", "AMPHETMU", "THCU", "LABBARB"    Pain Inventory Average Pain 7 Pain Right Now 10 My pain is constant, stabbing, and aching  In the last 24 hours, has pain interfered with the following? General activity 10 Relation with others 10 Enjoyment of life 10 What TIME of day is your pain at its worst? evening and night Sleep (in general) Fair  Pain is worse with: sitting, standing, and some  activites Pain improves with: medication Relief from Meds: 0  how many minutes can you walk? unlimited ability to climb steps?  yes do you drive?  yes Do you have any goals in this area?  yes  disabled: date disabled 1999  anxiety  Any changes since last visit?  no  New Patient    Family History  Problem Relation Age of Onset   Hypertension Mother    Diabetes Father    Social History   Socioeconomic History   Marital status: Single    Spouse name: Not on file   Number of children: Not on file   Years of education: Not on file   Highest education level: Not on file  Occupational History   Not on file  Tobacco Use   Smoking status: Former    Current packs/day: 0.00    Average packs/day: 1 pack/day for 10.0 years (10.0 ttl pk-yrs)    Types: Cigarettes    Start date: 2005    Quit date: 2015    Years since quitting: 10.0   Smokeless tobacco: Never   Tobacco comments:    quit smoking 5 yrs ago  Vaping Use   Vaping status: Every Day   Substances: Nicotine, Flavoring  Substance and Sexual Activity   Alcohol use: Yes    Comment: rarely   Drug use: Not Currently    Types: Marijuana    Comment: uses for pain management - last use 07/21/17   Sexual activity: Yes    Birth control/protection: None  Other Topics Concern   Not on file  Social History Narrative   Not on file   Social Drivers of Health   Financial Resource Strain: Low Risk  (10/21/2022)   Received from [facility]   Overall Financial Resource Strain (CARDIA)    Difficulty of Paying Living Expenses: Not hard at all  Food Insecurity: No Food Insecurity (10/21/2022)   Received from [facility]   Hunger Vital Sign    Worried About Running Out of Food in the Last Year: Never true    Ran Out of Food in the Last Year: Never true  Transportation Needs: No Transportation Needs (10/21/2022)   Received from [facility]   PRAPARE - Transportation    Lack of Transportation (Medical): No    Lack of  Transportation (Non-Medical): No  Physical Activity: Not on file  Stress: Not on file  Social Connections: Unknown (06/16/2021)   Received from [facility]   Social Network    Social Network: Not on file   Past Surgical History:  Procedure Laterality Date   BACK SURGERY     x2   battery replacement to spinal cord stimulator      epidural injections     ganglion cyst removed from right wrist     PAIN PUMP REMOVAL N/A 06/25/2016   Procedure: Removal of Implantable Pulse Generator;  Surgeon: Maeola Harman, MD;  Location: [facility] OR;  Service: Neurosurgery;  Laterality: N/A;   rotator cuff surgery Right    SPINAL CORD STIMULATOR INSERTION     SPINAL CORD STIMULATOR INSERTION N/A 06/25/2016   Procedure: LUMBAR SPINAL CORD STIMULATOR INSERTION AND  REMOVAL OF OLD PADDLE STIMULATOR;  Surgeon: Maeola Harman, MD;  Location: [facility];  Service: Neurosurgery;  Laterality: N/A;   TOTAL HIP ARTHROPLASTY Right 08/02/2017   TOTAL HIP ARTHROPLASTY Right 08/02/2017   Procedure: RIGHT TOTAL HIP ARTHROPLASTY ANTERIOR APPROACH;  Surgeon: Sheral Apley, MD;  Location: [facility] OR;  Service: Orthopedics;  Laterality: Right;   TOTAL HIP ARTHROPLASTY Left 08/29/2018   Procedure: TOTAL HIP ARTHROPLASTY ANTERIOR APPROACH;  Surgeon: Sheral Apley, MD;  Location: [facility] ORS;  Service: Orthopedics;  Laterality: Left;   warts removed from knees  70's   wisdom teeth extracted      Past Medical History:  Diagnosis Date   Anxiety    takes Valium nightly   Chronic back pain    several back surgeries   Complication of anesthesia    gets very anxious after anesthesia   Depression    History of bronchitis 2005   History of migraine    last one 6 months ago   Insomnia    takes Trazodone nightly   Pneumonia    hx of 2005   PTSD (post-traumatic stress disorder)    takes Lamictal,Minipress,and Latuda nightly   BP 138/85   Pulse 87   Ht 6' (1.829 m)   Wt 148 lb (67.1 kg)   SpO2 95%   BMI 20.07 kg/m   Opioid Risk  Score:   Fall Risk Score:  `1  Depression screen PHQ 2/9      No data to display          Review of Systems  Constitutional:  Positive for appetite change (situational).  Musculoskeletal:  Positive for back pain.        "Total spine pain"  All other systems reviewed and are negative.      Objective:   Physical Exam   PE: Constitution: Appropriate appearance for age. In mild emotional distress.  Resp: No respiratory distress. No accessory muscle usage. on RA and CTAB Cardio: Well perfused appearance. No peripheral edema. Abdomen: Nondistended. Nontender.   Psych: Tearful, but appropriate given recent death of a loved one.  Neuro: AAOx4. No apparent cognitive deficits   Neurologic Exam:   DTRs: Reflexes were 2+ in bilateral achilles, patella, biceps, BR and triceps. Babinsky: flexor responses b/l.   Hoffmans: negative b/l Sensory exam: revealed normal sensation in all dermatomal regions in bilateral upper extremities and bilateral lower extremities Motor exam: 4/5 strength throughout; BL UE limited by severe thoracic pain with AROM Coordination: Fine motor coordination was normal.   Gait: Antalgic, stiff, forward bending gait.    MSK: + ttp throughout bilateral thoracic and lumbar spinous processes, paraspinals, and lateral musculatrue; no TTP PSIS or SI jints.       Assessment & Plan:  Kayla Howard is a 58 y.o. year old female  who  has a past medical history of Anxiety, Chronic back pain, Complication of anesthesia, Depression, History of bronchitis (2005), History of migraine, Insomnia, Pneumonia, and PTSD (post-traumatic stress disorder).   chronic low back pain s/p fusion L4-S1 with spinal cord stimulator (nonfunctional), and T8-9 thoracic SCI (functional injury).   Chronic pain syndrome Encounter for therapeutic drug monitoring Long term prescription opiate use  Follow-up with me in 1 month.  If pain is well-controlled on your  current regimen, you will see my  NP Riley Lam every other month and me every 6 months.  If not, we will follow-up more frequently.  Feel free to use MyChart between appointments to discuss any acute issues, making usually get back to within 48 hours.   Today you signed a pain contract and performed a urine screen. If results are as expected, we will start Norco 2.5-5 mg three times daily as needed for pain. Plan will be to transition to lowest tolerable dose of medication vs. Butrans patch in the future, however given recent loss of a loved one will prioritize temporary symptom control.    - 1/24: UDS + valium (as expected), trazodone (as expected) and alcohol; ethyl glucuronide is >7000 ng/mg, whereas cutoff for heavy alcohol use within 24 hours is   500 ng/mg. Will reach out to patient this week regarding this before prescribing medication.   Not interested in Butrans patch due to possible psychiatric side effects.   Has lost appeals for tramadol in the past. Has failed morphine, gabapentin, and cymbalta in the past.   Chronic bilateral low back pain with sciatica, sciatica laterality unspecified Chronic bilateral thoracic back pain Impaired mobility and ADLs -     CT THORACIC SPINE WO CONTRAST; Future -     CT LUMBAR SPINE WO CONTRAST; Future  Did well with cervical and lumbar ESIs in the past with 2 months pain relief.   Pt has nonfunctional Spinal cord stimulator placed in 2010 that per her is not MRI compatable.   We will be getting CTs of your thoracic and lumbar spine to evaluate need for surgical intervention given you have failed PT and conservative management. I will call you or message through mychart regarding results.   I agree with not pursuing chiropractic manipulation, however accupuncture may be helpful. I recommend looking into [facility] in [place]. We can also refer you to Dr. Wynn Banker here if needed.   You can use OTC voltaren gel or salonpas patches for local pain control in your back as well.    Continue toradol shots through Dr. Jules Husbands    Pharmacy is [facility] in [place]

## 2023-02-21 NOTE — Patient Instructions (Addendum)
Follow-up with me in 1 month.  If pain is well-controlled on your current regimen, you will see my NP Riley Lam every other month and me every 6 months.  If not, we will follow-up more frequently.  Feel free to use MyChart between appointments to discuss any acute issues, making usually get back to within 48 hours.   Today you signed a pain contract and performed a urine screen. If results are as expected, we will start Norco 2.5-5 mg three times daily as needed for pain. Plan will be to transition to lowest tolerable dose of medication vs. Butrans patch in the future, however given recent loss of a loved one will prioritize temporary symptom control.  We will be getting CTs of your thoracic and lumbar spine to evaluate need for surgical intervention given you have failed PT and conservative management. I will call you or message through mychart regarding results.   I agree with not pursuing chiropractic manipulation, however accupuncture may be helpful. I recommend looking into [facility] in . We can also refer you to Dr. Wynn Banker here if needed.   You can use OTC voltaren gel or salonpas patches for local pain control in your back as well.   Continue toradol shots through Dr. Jules Husbands

## 2023-02-25 LAB — TOXASSURE SELECT,+ANTIDEPR,UR

## 2023-02-28 ENCOUNTER — Encounter: Payer: Self-pay | Admitting: Physical Medicine and Rehabilitation

## 2023-02-28 DIAGNOSIS — Z789 Other specified health status: Secondary | ICD-10-CM | POA: Insufficient documentation

## 2023-02-28 DIAGNOSIS — G894 Chronic pain syndrome: Secondary | ICD-10-CM | POA: Insufficient documentation

## 2023-02-28 DIAGNOSIS — Z79891 Long term (current) use of opiate analgesic: Secondary | ICD-10-CM | POA: Insufficient documentation

## 2023-02-28 DIAGNOSIS — Z5181 Encounter for therapeutic drug level monitoring: Secondary | ICD-10-CM | POA: Insufficient documentation

## 2023-02-28 NOTE — Progress Notes (Incomplete)
Subjective:    Patient ID: Kayla Howard, female    DOB: Sep 08, 1965, 58 y.o.   MRN: [id number]  HPI HPI  Kayla Howard is a 58 y.o. year old female  who  has a past medical history of Anxiety, Chronic back pain, Complication of anesthesia, Depression, History of bronchitis (2005), History of migraine, Insomnia, Pneumonia, and PTSD (post-traumatic stress disorder).   They are presenting to PM&R clinic as a new patient for pain management evaluation. They were referred by Dr. Milana Obey for treatment of chronic low back pain s/p fusion L4-S1 with spinal cord stimulator, and T8-9 thoracic SCI (functional injury).   Source: Thoracic spine > lumbar spine Inciting incident: 15 years ago she "coughed with pneumonia and blew out my L spine". 5 years ago, thoracic spine pain "just came on".   Description of pain: "agony" 10/10, constant, aching  around her thoracic spoine Exacerbating factors: "work" - bending, lifting, twisting Remitting factors: "sonobag" - large heated sleeping bag Red flag symptoms: No red flags for back pain endorsed in Hx or ROS  Medications tried: Topical medications (no effect) : none Nsaids (no effect) : Dr. Jules Husbands prescribed celebrex - "it didn't help at all"/ Ibuprofen for migraines PRN, rarely. Toradol 10 mg has not helped.  Tylenol  (no effect) : Takes rarely Opiates  (unsure of effect) : "Medicare wont cover tramadol." Has used oxycodone post-surgically in the past without side effects. Has terrible migraines/vomitting with morphine.   Gabapentin / Lyrica  (unsure of effect) : Gabapentin "makes me loopy" and causes falls. Have not tried Lyrica.  TCAs  (never tried) :  SNRIs  (no effect) : Cymbalta prescribed by Dr. Milana Obey last visit; stopped because it didn't work after 1.5 weeks.  Other  (no effect) : Started steroids with Dr. Milana Obey but stopped them shortly after due to xray findings. Valium 10 mg at nighttime for anxiety - not helpful for pain.   Other treatments: PT/OT   (no effect) : Last in September; was in for 6 weeks  Accupuncture/chiropractor/massage  (mild effect) : Went to chiropractor in June - got an xray of her back and "I never heard from them again".   She called and never received a call back. Used to do accupuncture when she was in [place], which was helpful.   TENs unit (never tried) : She has one at home but has never used it.   Injections (moderate effect) : [facility] and lumbar ESI 5 years ago; stopped because she needed hip replacements and because he stopped taking her insurance. They were helpful for 2 months at a time.   She gets toradol shots "as often as I can" for pain with Dr. Milana Obey. Takes the edge off her pain but does not last.   Surgery (moderate effect) : Spinal cord stimulator placed in 2010 - replaced with Dr. Venetia Maxon with [facility] but no longer functioning. Has [employer] contact but hasn't bothered to call because "it only works from my hips down because it was placed after my fusion".   Had L4-S1 spinal fusion 2010 in [place]  Got hip replacements 3 years ago staggered by 1 year with Dr. Eulah Pont; "that surgery was a godsend".   Goals for pain control: "I want to be able to bend and move like I'm supposed to".   Pain and sleep has been poor after her boyfriend's death recently; has noticed she will fall asleep and wake up in the same position.  She has a video appointment coming up with her psychiatrist   Prior UDS results: No results found for: "LABOPIA", "COCAINSCRNUR", "LABBENZ", "AMPHETMU", "THCU", "LABBARB"    Pain Inventory Average Pain 7 Pain Right Now 10 My pain is constant, stabbing, and aching  In the last 24 hours, has pain interfered with the following? General activity 10 Relation with others 10 Enjoyment of life 10 What TIME of day is your pain at its worst? evening and night Sleep (in general) Fair  Pain is worse with: sitting, standing, and some  activites Pain improves with: medication Relief from Meds: 0  how many minutes can you walk? unlimited ability to climb steps?  yes do you drive?  yes Do you have any goals in this area?  yes  disabled: date disabled 1999  anxiety  Any changes since last visit?  no  New Patient    Family History  Problem Relation Age of Onset  . Hypertension Mother   . Diabetes Father    Social History   Socioeconomic History  . Marital status: Single    Spouse name: Not on file  . Number of children: Not on file  . Years of education: Not on file  . Highest education level: Not on file  Occupational History  . Not on file  Tobacco Use  . Smoking status: Former    Current packs/day: 0.00    Average packs/day: 1 pack/day for 10.0 years (10.0 ttl pk-yrs)    Types: Cigarettes    Start date: 2005    Quit date: 2015    Years since quitting: 10.0  . Smokeless tobacco: Never  . Tobacco comments:    quit smoking 5 yrs ago  Vaping Use  . Vaping status: Every Day  . Substances: Nicotine, Flavoring  Substance and Sexual Activity  . Alcohol use: Yes    Comment: rarely  . Drug use: Not Currently    Types: Marijuana    Comment: uses for pain management - last use 07/21/17  . Sexual activity: Yes    Birth control/protection: None  Other Topics Concern  . Not on file  Social History Narrative  . Not on file   Social Drivers of Health   Financial Resource Strain: Low Risk  (10/21/2022)   Received from [facility]   Overall Financial Resource Strain (CARDIA)   . Difficulty of Paying Living Expenses: Not hard at all  Food Insecurity: No Food Insecurity (10/21/2022)   [facility]   Hunger Vital Sign   . Worried About Programme researcher, broadcasting/film/video in the Last Year: Never true   . Ran Out of Food in the Last Year: Never true  Transportation Needs: No Transportation Needs (10/21/2022)   Received from [facility]   [facility] - Transportation   . Lack of Transportation  (Medical): No   . Lack of Transportation (Non-Medical): No  Physical Activity: Not on file  Stress: Not on file  Social Connections: Unknown (06/16/2021)   Received from [facility]   Social Network   . Social Network: Not on file   Past Surgical History:  Procedure Laterality Date  . BACK SURGERY     x2  . battery replacement to spinal cord stimulator     . epidural injections    . ganglion cyst removed from right wrist    . PAIN PUMP REMOVAL N/A 06/25/2016   Procedure: Removal of Implantable Pulse Generator;  Surgeon: Maeola Harman, MD;  Location: [facility] OR;  Service: Neurosurgery;  Laterality: N/A;  . rotator cuff surgery Right   . SPINAL CORD STIMULATOR INSERTION    . SPINAL CORD STIMULATOR INSERTION N/A 06/25/2016   Procedure: LUMBAR SPINAL CORD STIMULATOR INSERTION AND  REMOVAL OF OLD PADDLE STIMULATOR;  Surgeon: Maeola Harman, MD;  Location[facility] OR;  Service: Neurosurgery;  Laterality: N/A;  . TOTAL HIP ARTHROPLASTY Right 08/02/2017  . TOTAL HIP ARTHROPLASTY Right 08/02/2017   Procedure: RIGHT TOTAL HIP ARTHROPLASTY ANTERIOR APPROACH;  Surgeon: Sheral Apley, MD;  Location: [facility] OR;  Service: Orthopedics;  Laterality: Right;  . TOTAL HIP ARTHROPLASTY Left 08/29/2018   Procedure: TOTAL HIP ARTHROPLASTY ANTERIOR APPROACH;  Surgeon: Sheral Apley, MD;  Location: [facility] ORS;  Service: Orthopedics;  Laterality: Left;  . warts removed from knees  70's  . wisdom teeth extracted      Past Medical History:  Diagnosis Date  . Anxiety    takes Valium nightly  . Chronic back pain    several back surgeries  . Complication of anesthesia    gets very anxious after anesthesia  . Depression   . History of bronchitis 2005  . History of migraine    last one 6 months ago  . Insomnia    takes Trazodone nightly  . Pneumonia    hx of 2005  . PTSD (post-traumatic stress disorder)    takes Lamictal,Minipress,and Latuda nightly   BP 138/85   Pulse 87   Ht 6' (1.829 m)   Wt 148 lb (67.1 kg)    SpO2 95%   BMI 20.07 kg/m   Opioid Risk Score:   Fall Risk Score:  `1  Depression screen PHQ 2/9      No data to display          Review of Systems  Constitutional:  Positive for appetite change (situational).  Musculoskeletal:  Positive for back pain.        "Total spine pain"  All other systems reviewed and are negative.      Objective:   Physical Exam   PE: Constitution: Appropriate appearance for age. No apparent distress *** +Obese Resp: No respiratory distress. No accessory muscle usage. {Blank multiple:19196::"***","on RA","CTAB","on *** L [place]"} Cardio: Well perfused appearance. *** No peripheral edema. Abdomen: Nondistended. Nontender.   Psych: Appropriate mood and affect. Neuro: AAOx4. No apparent cognitive deficits   Neurologic Exam:   DTRs: Reflexes were 2+ in bilateral achilles, patella, biceps, BR and triceps. Babinsky: flexor responses b/l.   Hoffmans: negative b/l Sensory exam: revealed normal sensation in all dermatomal regions in {Blank multiple:19196::"***","bilateral upper extremities","bilateral lower extremities","right upper extremity","left upper extremity","right lower extremity","left lower extremity","with reduced sensation to light touch in ***"} Motor exam: strength 5/5 throughout {Blank multiple:19196::"***","bilateral upper extremities","bilateral lower extremities","right upper extremity","left upper extremity","right lower extremity","left lower extremity","with exception of ***"} Coordination: Fine motor coordination was normal.   Gait: {Blank single:19197::"not observed due to safety concerns","normal","+trendelenberg","+antalgic gait","+foot drop","***"}    + ttp throughout bilateral thoracic and lumbar spinous processes, paraspinals, and lateral musculatrue; no TTP PSIS or SI jints.   Antalgic, stiff, sorward bending gait.   4/5 strength trouhgout -Bl UE limited by severe thoracic pain with AROM        Assessment & Plan:   Kayla Howard is a 58 y.o. year old female  who  has a past medical history of Anxiety, Chronic back pain, Complication of anesthesia, Depression, History of bronchitis (2005), History of migraine, Insomnia, Pneumonia, and PTSD (post-traumatic stress disorder).   They are presenting  to PM&R clinic as a new patient for treatment of *** . They were referred by *** . Based on their presentation, *** .  Chronic pain syndrome Encounter for therapeutic drug monitoring Long term prescription opiate use  Follow-up with me in 1 month.  If pain is well-controlled on your current regimen, you will see my NP Riley Lam every other month and me every 6 months.  If not, we will follow-up more frequently.  Feel free to use MyChart between appointments to discuss any acute issues, making usually get back to within 48 hours.   Today you signed a pain contract and performed a urine screen. If results are as expected, we will start Norco 2.5-5 mg three times daily as needed for pain. Plan will be to transition to lowest tolerable dose of medication vs. Butrans patch in the future, however given recent loss of a loved one will prioritize temporary symptom control.  Has lost appeals for tramadol in the past. Not interested in Butrans patch due to possible psychiatric side effects  Has failed morphine, gabap  Chronic bilateral low back pain with sciatica, sciatica laterality unspecified -     CT THORACIC SPINE WO CONTRAST; Future -     CT LUMBAR SPINE WO CONTRAST; Future  Chronic bilateral thoracic back pain -     CT THORACIC SPINE WO CONTRAST; Future -     CT LUMBAR SPINE WO CONTRAST; Future  Impaired mobility and ADLs -     CT THORACIC SPINE WO CONTRAST; Future -     CT LUMBAR SPINE WO CONTRAST; Future   We will be getting CTs of your thoracic and lumbar spine to evaluate need for surgical intervention given you have failed PT and conservative management. I will call you or message through mychart regarding  results.   I agree with not pursuing chiropractic manipulation, however accupuncture may be helpful. I recommend looking into [facility] in . We can also refer you to Dr. Wynn Banker here if needed.   You can use OTC voltaren gel or salonpas patches for local pain control in your back as well.   Continue toradol shots through Dr. Zena Amos is [facility] in [place]

## 2023-02-28 NOTE — Telephone Encounter (Signed)
See attached; UDS + alcohol, to an extent that I was not comfortable prescribing narcotics. She is agreeable to abstain moving forward and will re-test on Friday. I will send in the order for it. Thank you!

## 2023-03-04 ENCOUNTER — Telehealth: Payer: Self-pay | Admitting: *Deleted

## 2023-03-04 DIAGNOSIS — Z79891 Long term (current) use of opiate analgesic: Secondary | ICD-10-CM

## 2023-03-04 DIAGNOSIS — G894 Chronic pain syndrome: Secondary | ICD-10-CM

## 2023-03-04 DIAGNOSIS — Z5181 Encounter for therapeutic drug level monitoring: Secondary | ICD-10-CM

## 2023-03-04 NOTE — Telephone Encounter (Signed)
Order placed for UDS. Patient here to do her drug screen.

## 2023-03-09 LAB — TOXASSURE SELECT,+ANTIDEPR,UR

## 2023-03-10 MED ORDER — HYDROCODONE-ACETAMINOPHEN 5-325 MG PO TABS: 0.5000 | ORAL_TABLET | Freq: Three times a day (TID) | ORAL | 0 refills | Status: DC | PRN

## 2023-03-10 NOTE — Progress Notes (Signed)
 Results as expected; messaged patient.

## 2023-03-10 NOTE — Addendum Note (Signed)
 Addended by: Cherri Corns on: 03/10/2023 02:29 PM   Modules accepted: Orders

## 2023-03-16 ENCOUNTER — Ambulatory Visit
Admission: RE | Admit: 2023-03-16 | Discharge: 2023-03-16 | Disposition: A | Discharge status: Home / Self Care | Payer: Medicare (Managed Care) | Source: Ambulatory Visit | Attending: Physical Medicine and Rehabilitation | Admitting: Physical Medicine and Rehabilitation

## 2023-03-16 ENCOUNTER — Ambulatory Visit: Payer: Medicare (Managed Care) | Admitting: Physical Medicine and Rehabilitation

## 2023-03-16 DIAGNOSIS — G8929 Other chronic pain: Secondary | ICD-10-CM

## 2023-03-16 DIAGNOSIS — Z7409 Other reduced mobility: Secondary | ICD-10-CM

## 2023-03-23 ENCOUNTER — Ambulatory Visit: Payer: Medicare (Managed Care) | Admitting: Physical Medicine and Rehabilitation

## 2023-03-28 ENCOUNTER — Encounter
Payer: Medicare (Managed Care) | Attending: Physical Medicine and Rehabilitation | Admitting: Physical Medicine and Rehabilitation

## 2023-03-28 VITALS — BP 180/99 | HR 97 | Ht 72.0 in | Wt 166.0 lb

## 2023-03-28 DIAGNOSIS — M546 Pain in thoracic spine: Secondary | ICD-10-CM | POA: Diagnosis present

## 2023-03-28 DIAGNOSIS — I10 Essential (primary) hypertension: Secondary | ICD-10-CM | POA: Insufficient documentation

## 2023-03-28 DIAGNOSIS — Z7409 Other reduced mobility: Secondary | ICD-10-CM | POA: Diagnosis present

## 2023-03-28 DIAGNOSIS — Z79891 Long term (current) use of opiate analgesic: Secondary | ICD-10-CM | POA: Diagnosis present

## 2023-03-28 DIAGNOSIS — G8929 Other chronic pain: Secondary | ICD-10-CM | POA: Insufficient documentation

## 2023-03-28 DIAGNOSIS — Z5181 Encounter for therapeutic drug level monitoring: Secondary | ICD-10-CM | POA: Insufficient documentation

## 2023-03-28 DIAGNOSIS — G894 Chronic pain syndrome: Secondary | ICD-10-CM | POA: Diagnosis present

## 2023-03-28 DIAGNOSIS — Z789 Other specified health status: Secondary | ICD-10-CM | POA: Insufficient documentation

## 2023-03-28 DIAGNOSIS — M5440 Lumbago with sciatica, unspecified side: Secondary | ICD-10-CM | POA: Insufficient documentation

## 2023-03-28 MED ORDER — HYDROCODONE-ACETAMINOPHEN 5-325 MG PO TABS: 0.5000 | ORAL_TABLET | Freq: Three times a day (TID) | ORAL | 0 refills | Status: AC | PRN

## 2023-03-28 MED ORDER — HYDROCODONE-ACETAMINOPHEN 5-325 MG PO TABS: 0.5000 | ORAL_TABLET | Freq: Three times a day (TID) | ORAL | 0 refills | Status: DC | PRN

## 2023-03-28 NOTE — Patient Instructions (Signed)
 I have refilled your medications for 2 months; follow up with my NP Riley Lam in 2 months for re-evaluation.  Once CT results are back, I will message you through mychart. We may recommend going back to Dr. Charlott Holler for lumbar or thoracic epidural injections.   I recommend continuing muscle rub and spray to mid-thoracic area. You may benefit from trigger point injections, but we will wait on CT results before scheduling this

## 2023-03-28 NOTE — Progress Notes (Signed)
Subjective:    Patient ID: Kayla Howard, female    DOB: 07-27-1965, 58 y.o.   MRN: [id number]  HPI  Kayla Howard is a 58 y.o. year old female  who  has a past medical history of Anxiety, Chronic back pain, Complication of anesthesia, Depression, History of bronchitis (2005), History of migraine, Insomnia, Pneumonia, and PTSD (post-traumatic stress disorder).   They are presenting to PM&R clinic for follow up related to chronic low back pain s/p fusion L4-S1 with spinal cord stimulator (nonfunctional), and T8-9 thoracic SCI (functional injury).    .  Plan from last visit:   Chronic pain syndrome Encounter for therapeutic drug monitoring Long term prescription opiate use   Follow-up with me in 1 month.  If pain is well-controlled on your current regimen, you will see my NP Riley Lam every other month and me every 6 months.  If not, we will follow-up more frequently.  Feel free to use MyChart between appointments to discuss any acute issues, making usually get back to within 48 hours.    Today you signed a pain contract and performed a urine screen. If results are as expected, we will start Norco 2.5-5 mg three times daily as needed for pain. Plan will be to transition to lowest tolerable dose of medication vs. Butrans patch in the future, however given recent loss of a loved one will prioritize temporary symptom control.               - 1/24: UDS + valium (as expected), trazodone (as expected) and alcohol; ethyl glucuronide is >7000 ng/mg, whereas cutoff for heavy alcohol use within 24 hours is   500 ng/mg. Will reach out to patient this week regarding this before prescribing medication.    Not interested in Butrans patch due to possible psychiatric side effects.    Has lost appeals for tramadol in the past. Has failed morphine, gabapentin, and cymbalta in the past.    Chronic bilateral low back pain with sciatica, sciatica laterality unspecified Chronic bilateral thoracic back  pain Impaired mobility and ADLs -     CT THORACIC SPINE WO CONTRAST; Future -     CT LUMBAR SPINE WO CONTRAST; Future   Did well with cervical and lumbar ESIs in the past with 2 months pain relief.    Pt has nonfunctional Spinal cord stimulator placed in 2010 that per her is not MRI compatable.    We will be getting CTs of your thoracic and lumbar spine to evaluate need for surgical intervention given you have failed PT and conservative management. I will call you or message through mychart regarding results.    I agree with not pursuing chiropractic manipulation, however accupuncture may be helpful. I recommend looking into [facility] in [place]. We can also refer you to Dr. Wynn Banker here if needed.    You can use OTC voltaren gel or salonpas patches for local pain control in your back as well.    Continue toradol shots through Dr. Jules Husbands    Pharmacy is [facility] in [place]   Interval Hx:  - Therapies: Not doing HEP; is too exhausted from work. Ha sbene making candles at home for fun. Will get walking when weather is better.    - Follow ups: CTs thoracic and lumbar spine performed 2/12; not yet resulted. Appearance advanced DDD with spondylolysis at L5-S1 and modic changes, with some bridging osteophytes at that level as well. Thoracic appears generally well preserved.    - Falls: none   -  DME: She says she cannot use a back brace because "the spinal fusion scrunched my ribs down and none of them fit, they end up riding up" and "are useless". Has tried hard and corset style braces.    - Medications: 1 month of Norco 5 mg tabs to [facility] 03/10/23 -  "I kinda had to change my prescription a little; a half a tab does nothing but I have to take a whole tab". She takes 1/2 tab before work, 1 tab at work, and 1/2-1 whole tab at nighttime. It usually lasts about 4 hours and allows her to get through her shift. She says it brings her pain from a 9-10/10 to a 7/10.   Has been using biofreeze  spray on her mid-back; patches in the past were irritating and dont fit well. Not currently using muscle relaxers.   She says lumbar ESIs worked well for her in the past, used to last a few weeks to a few months, has gotten them across the street with Dr. Charlott Holler. Has been a few years.   Has never had trigger point injections.    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   Pain Inventory Average Pain 10 Pain Right Now 6 My pain is sharp  In the last 24 hours, has pain interfered with the following? General activity 0 Relation with others 0 Enjoyment of life 0 What TIME of day is your pain at its worst? night Sleep (in general) Fair  Pain is worse with: walking, bending, sitting, inactivity, standing, and some activites Pain improves with: pacing activities and medication Relief from Meds: 6  Family History  Problem Relation Age of Onset   Hypertension Mother    Diabetes Father    Social History   Socioeconomic History   Marital status: Single    Spouse name: Not on file   Number of children: Not on file   Years of education: Not on file   Highest education level: Not on file  Occupational History   Not on file  Tobacco Use   Smoking status: Former    Current packs/day: 0.00    Average packs/day: 1 pack/day for 10.0 years (10.0 ttl pk-yrs)    Types: Cigarettes    Start date: 2005    Quit date: 2015    Years since quitting: 10.1   Smokeless tobacco: Never   Tobacco comments:    quit smoking 5 yrs ago  Vaping Use   Vaping status: Every Day   Substances: Nicotine, Flavoring  Substance and Sexual Activity   Alcohol use: Yes    Comment: rarely   Drug use: Not Currently    Types: Marijuana    Comment: uses for pain  management - last use 07/21/17   Sexual activity: Yes    Birth control/protection: None  Other Topics Concern   Not on file  Social History Narrative   Not on file   Social Drivers of Health   Financial Resource Strain: Low Risk  (10/21/2022)   Received from [facility]   Overall Financial Resource Strain (CARDIA)    Difficulty of Paying Living Expenses: Not hard at all  Food Insecurity: No Food Insecurity (10/21/2022)  [facility]   Hunger Vital Sign    Worried About Running Out of Food in the Last Year: Never true    Ran Out of Food in the Last Year: Never true  Transportation Needs: No Transportation Needs (10/21/2022)   Received [facility] - [facility]    Lack of Transportation (Medical): No    Lack of Transportation (Non-Medical): No  Physical Activity: Not on file  Stress: Not on file  Social Connections: Unknown (06/16/2021)   Received from [facility]   Social Network    Social Network: Not on file   Past Surgical History:  Procedure Laterality Date   BACK SURGERY     x2   battery replacement to spinal cord stimulator      epidural injections     ganglion cyst removed from right wrist     PAIN PUMP REMOVAL N/A 06/25/2016   Procedure: Removal of Implantable Pulse Generator;  Surgeon: Maeola Harman, MD;  Location: [facility] OR;  Service: Neurosurgery;  Laterality: N/A;   rotator cuff surgery Right    SPINAL CORD STIMULATOR INSERTION     SPINAL CORD STIMULATOR INSERTION N/A 06/25/2016   Procedure: LUMBAR SPINAL CORD STIMULATOR INSERTION AND  REMOVAL OF OLD PADDLE STIMULATOR;  Surgeon: Maeola Harman, MD;  Location: [facility] OR;  Service: Neurosurgery;  Laterality: N/A;   TOTAL HIP ARTHROPLASTY Right 08/02/2017   TOTAL HIP ARTHROPLASTY Right 08/02/2017   Procedure: RIGHT TOTAL HIP ARTHROPLASTY ANTERIOR APPROACH;  Surgeon: Sheral Apley, MD;  Location: [facility] OR;  Service: Orthopedics;  Laterality: Right;   TOTAL HIP ARTHROPLASTY Left  08/29/2018   Procedure: TOTAL HIP ARTHROPLASTY ANTERIOR APPROACH;  Surgeon: Sheral Apley, MD;  Location: [facility] ORS;  Service: Orthopedics;  Laterality: Left;   warts removed from knees  70's   wisdom teeth extracted      Past Surgical History:  Procedure Laterality Date   BACK SURGERY     x2   battery replacement to spinal cord stimulator      epidural injections     ganglion cyst removed from right wrist     PAIN PUMP REMOVAL N/A 06/25/2016   Procedure: Removal of Implantable Pulse Generator;  Surgeon: Maeola Harman, MD;  Location: [facility] OR;  Service: Neurosurgery;  Laterality: N/A;   rotator cuff surgery Right    SPINAL CORD STIMULATOR INSERTION     SPINAL CORD STIMULATOR INSERTION N/A 06/25/2016   Procedure: LUMBAR SPINAL CORD STIMULATOR INSERTION AND  REMOVAL OF OLD PADDLE STIMULATOR;  Surgeon: Maeola Harman, MD;  Location: [facility] OR;  Service: Neurosurgery;  Laterality: N/A;   TOTAL HIP ARTHROPLASTY Right 08/02/2017   TOTAL HIP ARTHROPLASTY Right 08/02/2017   Procedure: RIGHT TOTAL HIP ARTHROPLASTY ANTERIOR APPROACH;  Surgeon: Sheral Apley, MD;  Location: [facility] OR;  Service: Orthopedics;  Laterality: Right;   TOTAL HIP ARTHROPLASTY Left 08/29/2018   Procedure: TOTAL HIP ARTHROPLASTY ANTERIOR APPROACH;  Surgeon: Sheral Apley, MD;  Location: [facility] ORS;  Service: Orthopedics;  Laterality: Left;   warts removed from knees  70's   wisdom teeth extracted      Past Medical History:  Diagnosis Date   Anxiety    takes Valium nightly   Chronic back pain    several back surgeries   Complication of anesthesia    gets very anxious after anesthesia   Depression    History of bronchitis 2005   History of migraine    last one 6 months ago  Insomnia    takes Trazodone nightly   Pneumonia    hx of 2005   PTSD (post-traumatic stress disorder)    takes Lamictal,Minipress,and Latuda nightly   BP (!) 192/92   Pulse 97   Ht 6' (1.829 m)   Wt 166 lb (75.3 kg)   SpO2 99%   BMI 22.51 kg/m    Opioid Risk Score:   Fall Risk Score:  `1  Depression screen [facility] 2/9     02/21/2023   10:06 AM  Depression screen PHQ 2/9  Decreased Interest 2  Down, Depressed, Hopeless 1  PHQ - 2 Score 3  Altered sleeping 1  Tired, decreased energy 2  Change in appetite 2  Feeling bad or failure about yourself  1  Trouble concentrating 2  Moving slowly or fidgety/restless 0  Suicidal thoughts 0  PHQ-9 Score 11      Review of Systems  All other systems reviewed and are negative.     Objective:   Physical Exam PE: Constitution: Appropriate appearance for age. In mild emotional distress.  Resp: No respiratory distress. No accessory muscle usage. on RA and CTAB Cardio: Well perfused appearance. No peripheral edema. Abdomen: Nondistended. Nontender.   Psych: Tearful, but appropriate given recent death of a loved one.  Neuro: AAOx4. No apparent cognitive deficits    Neurologic Exam:   Sensory exam: revealed normal sensation in all dermatomal regions in bilateral upper extremities and bilateral lower extremities Motor exam: 4/5 strength throughout Coordination: Fine motor coordination was normal.   Gait: Antalgic, stiff, forward bending gait.     MSK: Reduced cervical lordosis and thoracic kyphosis + ttp throughout bilateral thoracic T2-5 >>> lumbar L3-S1 paraspinals, + TTP B/L PSIS, none in SI joints.  + facet loading bilaterally Pain releived with slump       Assessment & Plan:   Kayla Howard is a 58 y.o. year old female  who  has a past medical history of Anxiety, Chronic back pain, Complication of anesthesia, Depression, History of bronchitis (2005), History of migraine, Insomnia, Pneumonia, and PTSD (post-traumatic stress disorder).   They are presenting to PM&R clinic for follow up related to chronic low back pain s/p fusion L4-S1 with spinal cord stimulator (nonfunctional), and T8-9 thoracic SCI (functional injury).   Chronic pain syndrome Encounter for therapeutic drug  monitoring Encounter for long-term use of opiate analgesic I have refilled your medications for 2 months; follow up with my NP Riley Lam in 2 months for re-evaluation.  Chronic bilateral thoracic back pain I recommend continuing muscle rub and spray to mid-thoracic area. You may benefit from trigger point injections, but we will wait on CT results before scheduling this   Chronic bilateral low back pain with sciatica, sciatica laterality unspecified Impaired mobility and ADLs Once CT results are back, I will message you through mychart. We may recommend going back to Dr. Charlott Holler for lumbar or thoracic epidural injections.   Essential hypertension Discussed with patient daily readings at home and contacting PCP if consistently >150 SBP  Other orders -     HYDROcodone-Acetaminophen; Take 0.5-1 tablets by mouth 3 (three) times daily as needed for moderate pain (pain score 4-6) or severe pain (pain score 7-10). Do not fill before 04/11/23  Dispense: 90 tablet; Refill: 0 -     HYDROcodone-Acetaminophen; Take 0.5-1 tablets by mouth 3 (three) times daily as needed for moderate pain (pain score 4-6) or severe pain (pain score 7-10). Do not fill before 05/11/23  Dispense: 90 tablet; Refill:  0     

## 2023-04-05 ENCOUNTER — Encounter: Payer: Self-pay | Admitting: Physical Medicine and Rehabilitation

## 2023-05-26 ENCOUNTER — Encounter: Payer: Medicare (Managed Care) | Admitting: Registered Nurse

## 2023-05-30 ENCOUNTER — Encounter: Payer: Medicare (Managed Care) | Attending: Physical Medicine and Rehabilitation | Admitting: Registered Nurse

## 2023-05-30 ENCOUNTER — Encounter: Payer: Self-pay | Admitting: Registered Nurse

## 2023-05-30 VITALS — BP 154/89 | HR 77 | Ht 72.0 in | Wt 171.4 lb

## 2023-05-30 DIAGNOSIS — W19XXXD Unspecified fall, subsequent encounter: Secondary | ICD-10-CM | POA: Diagnosis not present

## 2023-05-30 DIAGNOSIS — Y92009 Unspecified place in unspecified non-institutional (private) residence as the place of occurrence of the external cause: Secondary | ICD-10-CM

## 2023-05-30 DIAGNOSIS — M545 Low back pain, unspecified: Secondary | ICD-10-CM | POA: Insufficient documentation

## 2023-05-30 DIAGNOSIS — Z79891 Long term (current) use of opiate analgesic: Secondary | ICD-10-CM | POA: Diagnosis not present

## 2023-05-30 DIAGNOSIS — M546 Pain in thoracic spine: Secondary | ICD-10-CM | POA: Insufficient documentation

## 2023-05-30 DIAGNOSIS — G894 Chronic pain syndrome: Secondary | ICD-10-CM | POA: Diagnosis present

## 2023-05-30 DIAGNOSIS — I10 Essential (primary) hypertension: Secondary | ICD-10-CM | POA: Diagnosis not present

## 2023-05-30 DIAGNOSIS — G8929 Other chronic pain: Secondary | ICD-10-CM | POA: Diagnosis present

## 2023-05-30 DIAGNOSIS — M47816 Spondylosis without myelopathy or radiculopathy, lumbar region: Secondary | ICD-10-CM | POA: Diagnosis not present

## 2023-05-30 DIAGNOSIS — Z5181 Encounter for therapeutic drug level monitoring: Secondary | ICD-10-CM | POA: Insufficient documentation

## 2023-05-30 DIAGNOSIS — R296 Repeated falls: Secondary | ICD-10-CM | POA: Insufficient documentation

## 2023-05-30 MED ORDER — HYDROCODONE-ACETAMINOPHEN 5-325 MG PO TABS: 0.5000 | ORAL_TABLET | Freq: Three times a day (TID) | ORAL | 0 refills | Status: AC | PRN

## 2023-05-30 MED ORDER — HYDROCODONE-ACETAMINOPHEN 5-325 MG PO TABS: 0.5000 | ORAL_TABLET | Freq: Three times a day (TID) | ORAL | 0 refills | Status: DC | PRN

## 2023-05-30 NOTE — Progress Notes (Signed)
 Subjective:    Patient ID: Kayla Howard, female    DOB: [birth year], 58 y.o.   MRN: [id number]  HPI: Kayla Howard is a 58 y.o. female who returns for follow up appointment for chronic pain and medication refill. She states her pain is located in her mid- lower back. She rates her pain 9. Her current exercise regime is walking and performing stretching exercises.  [name] arrived hypertensive, blood pressure was re-checked. She was instructed to keep blood pressure log and F/U with her PCP, she verbalizes understanding.   [name] reports three weeks ago she was at work , heard a pop in her back and fell forward, she was able to pick herself up. She didn't seek medical attention. Educated on falls prevention, she verbalizes understanding. Referral placed to neurosurgery, in the past she seen Dr Nigel Bart, she verbalizes understanding.   She also reports she had fallen twice in her home and landed on her side, she didn't seek medical attention. Educated on falls prevention, she verbalizes understanding.   Ms. Strength Morphine equivalent is 15.00  MME. She  is also prescribed Diazepam   by Dr. Akintayo .We have discussed the black box warning of using opioids and benzodiazepines. I highlighted the dangers of using these drugs together and discussed the adverse events including respiratory suppression, overdose, cognitive impairment and importance of compliance with current regimen. We will continue to monitor and adjust as indicated.  she is being closely monitored and under the care of her psychiatrist.  Last UDS was Performed on 03/04/2023, see note for details.      Pain Inventory Average Pain 10 Pain Right Now 9 My pain is sharp and stabbing  In the last 24 hours, has pain interfered with the following? General activity 9 Relation with others 6 Enjoyment of life 4 What TIME of day is your pain at its worst? morning , evening, and night Sleep (in general) Fair  Pain is worse with:  walking, sitting, and standing Pain improves with: rest and heat/ice Relief from Meds: 5  Family History  Problem Relation Age of Onset   Hypertension Mother    Diabetes Father    Social History   Socioeconomic History   Marital status: Single    Spouse name: Not on file   Number of children: Not on file   Years of education: Not on file   Highest education level: Not on file  Occupational History   Not on file  Tobacco Use   Smoking status: Former    Current packs/day: 0.00    Average packs/day: 1 pack/day for 10.0 years (10.0 ttl pk-yrs)    Types: Cigarettes    Start date: 2005    Quit date: 2015    Years since quitting: 10.3   Smokeless tobacco: Never   Tobacco comments:    quit smoking 5 yrs ago  Vaping Use   Vaping status: Every Day   Substances: Nicotine, Flavoring  Substance and Sexual Activity   Alcohol  use: Yes    Comment: rarely   Drug use: Not Currently    Types: Marijuana    Comment: uses for pain management - last use 07/21/17   Sexual activity: Yes    Birth control/protection: None  Other Topics Concern   Not on file  Social History Narrative   Not on file   Social Drivers of Health   Financial Resource Strain: Low Risk  (10/21/2022)   Received from [facility]   Overall Financial Resource Strain (  CARDIA)    Difficulty of Paying Living Expenses: Not hard at all  Food Insecurity: No Food Insecurity (10/21/2022)   Received from [facility]   Hunger Vital Sign    Worried About Running Out of Food in the Last Year: Never true    Ran Out of Food in the Last Year: Never true  Transportation Needs: No Transportation Needs (10/21/2022)   Received from [facility] - Transportation    Lack of Transportation (Medical): No    Lack of Transportation (Non-Medical): No  Physical Activity: Not on file  Stress: Not on file  Social Connections: Unknown (06/16/2021)   Received from [facility]   Social Network    Social Network: Not on  file   Past Surgical History:  Procedure Laterality Date   BACK SURGERY     x2   battery replacement to spinal cord stimulator      epidural injections     ganglion cyst removed from right wrist     PAIN PUMP REMOVAL N/A 06/25/2016   Procedure: Removal of Implantable Pulse Generator;  Surgeon: Manya Sells, MD;  Location: [facility] OR;  Service: Neurosurgery;  Laterality: N/A;   rotator cuff surgery Right    SPINAL CORD STIMULATOR INSERTION     SPINAL CORD STIMULATOR INSERTION N/A 06/25/2016   Procedure: LUMBAR SPINAL CORD STIMULATOR INSERTION AND  REMOVAL OF OLD PADDLE STIMULATOR;  Surgeon: Manya Sells, MD;  Location: [facility] OR;  Service: Neurosurgery;  Laterality: N/A;   TOTAL HIP ARTHROPLASTY Right 08/02/2017   TOTAL HIP ARTHROPLASTY Right 08/02/2017   Procedure: RIGHT TOTAL HIP ARTHROPLASTY ANTERIOR APPROACH;  Surgeon: Saundra Curl, MD;  Location: [facility] OR;  Service: Orthopedics;  Laterality: Right;   TOTAL HIP ARTHROPLASTY Left 08/29/2018   Procedure: TOTAL HIP ARTHROPLASTY ANTERIOR APPROACH;  Surgeon: Saundra Curl, MD;  Location: [facility] ORS;  Service: Orthopedics;  Laterality: Left;   warts removed from knees  70's   wisdom teeth extracted      Past Surgical History:  Procedure Laterality Date   BACK SURGERY     x2   battery replacement to spinal cord stimulator      epidural injections     ganglion cyst removed from right wrist     PAIN PUMP REMOVAL N/A 06/25/2016   Procedure: Removal of Implantable Pulse Generator;  Surgeon: Manya Sells, MD;  Location: [facility] OR;  Service: Neurosurgery;  Laterality: N/A;   rotator cuff surgery Right    SPINAL CORD STIMULATOR INSERTION     SPINAL CORD STIMULATOR INSERTION N/A 06/25/2016   Procedure: LUMBAR SPINAL CORD STIMULATOR INSERTION AND  REMOVAL OF OLD PADDLE STIMULATOR;  Surgeon: Manya Sells, MD;  Location: [facility] OR;  Service: Neurosurgery;  Laterality: N/A;   TOTAL HIP ARTHROPLASTY Right 08/02/2017   TOTAL HIP ARTHROPLASTY Right 08/02/2017    Procedure: RIGHT TOTAL HIP ARTHROPLASTY ANTERIOR APPROACH;  Surgeon: Saundra Curl, MD;  Location: [facility] OR;  Service: Orthopedics;  Laterality: Right;   TOTAL HIP ARTHROPLASTY Left 08/29/2018   Procedure: TOTAL HIP ARTHROPLASTY ANTERIOR APPROACH;  Surgeon: Saundra Curl, MD;  Location: [facility] ORS;  Service: Orthopedics;  Laterality: Left;   warts removed from knees  70's   wisdom teeth extracted      Past Medical History:  Diagnosis Date   Anxiety    takes Valium  nightly   Chronic back pain    several back surgeries   Complication of anesthesia    gets very anxious after anesthesia  Depression    History of bronchitis 2005   History of migraine    last one 6 months ago   Insomnia    takes Trazodone  nightly   Pneumonia    hx of 2005   PTSD (post-traumatic stress disorder)    takes Lamictal ,Minipress ,and Latuda  nightly   There were no vitals taken for this visit.  Opioid Risk Score:   Fall Risk Score:  `1  Depression screen [facility] 2/9     02/21/2023   10:06 AM  Depression screen PHQ 2/9  Decreased Interest 2  Down, Depressed, Hopeless 1  PHQ - 2 Score 3  Altered sleeping 1  Tired, decreased energy 2  Change in appetite 2  Feeling bad or failure about yourself  1  Trouble concentrating 2  Moving slowly or fidgety/restless 0  Suicidal thoughts 0  PHQ-9 Score 11    Review of Systems  Musculoskeletal:  Positive for back pain.  All other systems reviewed and are negative.     Objective:   Physical Exam Vitals and nursing note reviewed.  Constitutional:      Appearance: Normal appearance.  Cardiovascular:     Rate and Rhythm: Normal rate and regular rhythm.     Pulses: Normal pulses.     Heart sounds: Normal heart sounds.  Pulmonary:     Effort: Pulmonary effort is normal.     Breath sounds: Normal breath sounds.  Musculoskeletal:     Comments: Normal Muscle Bulk and Muscle Testing Reveals:  Upper Extremities: Decreased ROM 90 Degrees  and Muscle Strength  5/5 Thoracic and Lumbar Hypersensitivity Lower Extremities : Full ROM and Muscle Strength 5/5 Arises from Table slowly Antalgic Gait     Skin:    General: Skin is warm and dry.  Neurological:     Mental Status: She is alert and oriented to person, place, and time.  Psychiatric:        Mood and Affect: Mood normal.        Behavior: Behavior normal.         Assessment & Plan:  Lumbar Spondylosis: RX: Referral Neurosurgery. In the past was a patient of Dr Nigel Bart. Continue HEP as Tolerated. Continue to Monitor.  Chronic Bilateral Thoracic Back Pain: Continue HEP as Tolerated. Continue to Monitor.  Chronic Bilateral Low Back Pain without Sciatica: Continue HEP as Tolerated. Continue to Monitor.  Essential Hypertension: Blood Pressure was Rechecked: Ms. Deloza wil F/U with her PCP, and keep a blood pressure log. She verbalizes understanding.  Chronic Pain Syndrome: Refilled: Hydrocodone  5 mg 0.5- 1 tablet three times a day as needed or pain #90. We will continue the opioid monitoring program, this consists of regular clinic visits, examinations, urine drug screen, pill counts as well as use of [place]  Controlled Substance Reporting system. A 12 month History has been reviewed on the [place]  Controlled Substance Reporting System on 05/30/2023   Fall at [facility] subsequent encounter/ Frequent falls: Referral placed to [facility] Neurosurgery, she verbalizes understanding. Educated on falls prevention, she was instructed to use her cane at all times, she verbalizes understanding.   F/U in 2 months

## 2023-08-01 ENCOUNTER — Encounter: Payer: Medicare (Managed Care) | Attending: Registered Nurse | Admitting: Registered Nurse

## 2023-08-01 ENCOUNTER — Encounter: Payer: Self-pay | Admitting: Registered Nurse

## 2023-08-01 VITALS — BP 107/67 | HR 70 | Ht 72.0 in | Wt 149.5 lb

## 2023-08-01 DIAGNOSIS — M546 Pain in thoracic spine: Secondary | ICD-10-CM | POA: Insufficient documentation

## 2023-08-01 DIAGNOSIS — Z5181 Encounter for therapeutic drug level monitoring: Secondary | ICD-10-CM | POA: Insufficient documentation

## 2023-08-01 DIAGNOSIS — G8929 Other chronic pain: Secondary | ICD-10-CM | POA: Diagnosis present

## 2023-08-01 DIAGNOSIS — G894 Chronic pain syndrome: Secondary | ICD-10-CM | POA: Insufficient documentation

## 2023-08-01 DIAGNOSIS — M47816 Spondylosis without myelopathy or radiculopathy, lumbar region: Secondary | ICD-10-CM | POA: Diagnosis present

## 2023-08-01 DIAGNOSIS — Z79891 Long term (current) use of opiate analgesic: Secondary | ICD-10-CM | POA: Diagnosis present

## 2023-08-01 MED ORDER — HYDROCODONE-ACETAMINOPHEN 5-325 MG PO TABS: 0.5000 | ORAL_TABLET | Freq: Three times a day (TID) | ORAL | 0 refills | Status: DC | PRN

## 2023-08-01 NOTE — Progress Notes (Signed)
 Subjective:    Patient ID: Kayla Howard, female    DOB: 15-Feb-1965, 58 y.o.   MRN: [id number]  HPI: Kayla Howard is a 58 y.o. female who returns for follow up appointment for chronic pain and medication refill. She states her pain is located in her upper- lower back. She rates her pain 2. Her current exercise regime is walking and performing stretching exercises.  [name] Morphine equivalent is 15.00 MME.       Pain Inventory Average Pain 4 Pain Right Now 2 My pain is sharp and stabbing  In the last 24 hours, has pain interfered with the following? General activity 5 Relation with others 5 Enjoyment of life 5 What TIME of day is your pain at its worst? morning  and night Sleep (in general) fair  Pain is worse with: sitting Pain improves with:rest Relief from Meds: 8  Family History  Problem Relation Age of Onset   Hypertension Mother    Diabetes Father    Social History   Socioeconomic History   Marital status: Single    Spouse name: Not on file   Number of children: Not on file   Years of education: Not on file   Highest education level: Not on file  Occupational History   Not on file  Tobacco Use   Smoking status: Former    Current packs/day: 0.00    Average packs/day: 1 pack/day for 10.0 years (10.0 ttl pk-yrs)    Types: Cigarettes    Start date: 2005    Quit date: 2015    Years since quitting: 10.5   Smokeless tobacco: Never   Tobacco comments:    quit smoking 5 yrs ago  Vaping Use   Vaping status: Every Day   Substances: Nicotine, Flavoring  Substance and Sexual Activity   Alcohol  use: Yes    Comment: rarely   Drug use: Not Currently    Types: Marijuana    Comment: uses for pain management - last use 07/21/17   Sexual activity: Yes    Birth control/protection: None  Other Topics Concern   Not on file  Social History Narrative   Not on file   Social Drivers of Health   Financial Resource Strain: Low Risk  (10/21/2022)   Received from [facility]   Overall Financial Resource Strain (CARDIA)    Difficulty of Paying Living Expenses: Not hard at all  Food Insecurity: No Food Insecurity (06/02/2023)   Received from [facility]   Hunger Vital Sign    Within the past 12 months, you worried that your food would run out before you got the money to buy more.: Never true    Within the past 12 months, the food you bought just didn't last and you didn't have money to get more.: Never true  Transportation Needs: No Transportation Needs (06/02/2023)   Received from [facility]   PRAPARE - Transportation    Lack of Transportation (Medical): No    Lack of Transportation (Non-Medical): No  Physical Activity: Not on file  Stress: Not on file  Social Connections: Unknown (06/16/2021)   [facility]   Social Network    Social Network: Not on file   Past Surgical History:  Procedure Laterality Date   BACK SURGERY     x2   battery replacement to spinal cord stimulator      epidural injections     ganglion cyst removed from right wrist  PAIN PUMP REMOVAL N/A 06/25/2016   Procedure: Removal of Implantable Pulse Generator;  Surgeon: Unice Pac, MD;  Location: [facility] OR;  Service: Neurosurgery;  Laterality: N/A;   rotator cuff surgery Right    SPINAL CORD STIMULATOR INSERTION     SPINAL CORD STIMULATOR INSERTION N/A 06/25/2016   Procedure: LUMBAR SPINAL CORD STIMULATOR INSERTION AND  REMOVAL OF OLD PADDLE STIMULATOR;  Surgeon: Unice Pac, MD;  Location: [facility] OR;  Service: Neurosurgery;  Laterality: N/A;   TOTAL HIP ARTHROPLASTY Right 08/02/2017   TOTAL HIP ARTHROPLASTY Right 08/02/2017   Procedure: RIGHT TOTAL HIP ARTHROPLASTY ANTERIOR APPROACH;  Surgeon: Beverley Evalene BIRCH, MD;  Location: [facility] OR;  Service: Orthopedics;  Laterality: Right;   TOTAL HIP ARTHROPLASTY Left 08/29/2018   Procedure: TOTAL HIP ARTHROPLASTY ANTERIOR APPROACH;  Surgeon: Beverley Evalene BIRCH, MD;  Location: [facility] ORS;  Service: Orthopedics;  Laterality:  Left;   warts removed from knees  70's   wisdom teeth extracted      Past Surgical History:  Procedure Laterality Date   BACK SURGERY     x2   battery replacement to spinal cord stimulator      epidural injections     ganglion cyst removed from right wrist     PAIN PUMP REMOVAL N/A 06/25/2016   Procedure: Removal of Implantable Pulse Generator;  Surgeon: Unice Pac, MD;  Location: [facility] OR;  Service: Neurosurgery;  Laterality: N/A;   rotator cuff surgery Right    SPINAL CORD STIMULATOR INSERTION     SPINAL CORD STIMULATOR INSERTION N/A 06/25/2016   Procedure: LUMBAR SPINAL CORD STIMULATOR INSERTION AND  REMOVAL OF OLD PADDLE STIMULATOR;  Surgeon: Unice Pac, MD;  Location: [facility] OR;  Service: Neurosurgery;  Laterality: N/A;   TOTAL HIP ARTHROPLASTY Right 08/02/2017   TOTAL HIP ARTHROPLASTY Right 08/02/2017   Procedure: RIGHT TOTAL HIP ARTHROPLASTY ANTERIOR APPROACH;  Surgeon: Beverley Evalene BIRCH, MD;  Location: [facility] OR;  Service: Orthopedics;  Laterality: Right;   TOTAL HIP ARTHROPLASTY Left 08/29/2018   Procedure: TOTAL HIP ARTHROPLASTY ANTERIOR APPROACH;  Surgeon: Beverley Evalene BIRCH, MD;  Location: [facility] ORS;  Service: Orthopedics;  Laterality: Left;   warts removed from knees  70's   wisdom teeth extracted      Past Medical History:  Diagnosis Date   Anxiety    takes Valium  nightly   Chronic back pain    several back surgeries   Complication of anesthesia    gets very anxious after anesthesia   Depression    History of bronchitis 2005   History of migraine    last one 6 months ago   Insomnia    takes Trazodone  nightly   Pneumonia    hx of 2005   PTSD (post-traumatic stress disorder)    takes Lamictal ,Minipress ,and Latuda  nightly   BP 107/67   Pulse 70   Ht 6' (1.829 m)   Wt 149 lb 8 oz (67.8 kg)   SpO2 97%   BMI 20.28 kg/m   Opioid Risk Score:   Fall Risk Score:  `1  Depression screen [facility] 2/9     05/30/2023    9:53 AM 02/21/2023   10:06 AM  Depression screen PHQ 2/9   Decreased Interest 0 2  Down, Depressed, Hopeless 0 1  PHQ - 2 Score 0 3  Altered sleeping  1  Tired, decreased energy  2  Change in appetite  2  Feeling bad or failure about yourself   1  Trouble concentrating  2  Moving slowly  or fidgety/restless  0  Suicidal thoughts  0  PHQ-9 Score  11     Review of Systems  Musculoskeletal:  Positive for back pain.  All other systems reviewed and are negative.       Objective:   Physical Exam Vitals and nursing note reviewed.  Constitutional:      Appearance: Normal appearance.  Cardiovascular:     Rate and Rhythm: Normal rate and regular rhythm.     Pulses: Normal pulses.     Heart sounds: Normal heart sounds.  Pulmonary:     Effort: Pulmonary effort is normal.     Breath sounds: Normal breath sounds.  Musculoskeletal:     Comments: Normal Muscle Bulk and Muscle Testing Reveals:  Upper Extremities: Decreased ROM 90 Degrees and Muscle Strength 5/5 Bilateral AC Joint Tenderness Lumbar Paraspinal Tenderness: L-4-L-5  Lower Extremities: Full ROM and Muscle Strength 5/5 Arises from Table with ease Narrow Based Gait     Skin:    General: Skin is warm and dry.  Neurological:     Mental Status: She is alert and oriented to person, place, and time.  Psychiatric:        Mood and Affect: Mood normal.        Behavior: Behavior normal.          Assessment & Plan:  Lumbar Spondylosis: Continue HEP as Tolerated. Continue to Monitor. 08/01/2023 Chronic Bilateral Thoracic Back Pain: Continue HEP as Tolerated. Continue to Monitor. 08/01/2023 Chronic Bilateral Low Back Pain without Sciatica: Continue HEP as Tolerated. Continue to Monitor. 08/01/2023 4. Chronic Pain Syndrome: Refilled: Hydrocodone  5 mg 0.5- 1 tablet three times a day as needed or pain #80. We will continue the opioid monitoring program, this consists of regular clinic visits, examinations, urine drug screen, pill counts as well as use of [place]  Controlled  Substance Reporting system. A 12 month History has been reviewed on the [place]  Controlled Substance Reporting System on 08/01/2023    F/U in 2 months

## 2023-08-01 NOTE — Patient Instructions (Addendum)
 [facility]: Referral was placed 06/03/2023  [address], [place], [place] [address] Phone: [phone]

## 2023-09-08 ENCOUNTER — Other Ambulatory Visit: Payer: Self-pay | Admitting: Registered Nurse

## 2023-09-08 NOTE — Telephone Encounter (Signed)
 Patient requesting refill.

## 2023-09-08 NOTE — Telephone Encounter (Signed)
PMP was Reviewed.  Hydrocodone e-scribed to pharmacy.

## 2023-09-28 ENCOUNTER — Encounter: Payer: Self-pay | Admitting: Physical Medicine and Rehabilitation

## 2023-09-28 ENCOUNTER — Encounter: Payer: Medicare (Managed Care) | Attending: Registered Nurse | Admitting: Physical Medicine and Rehabilitation

## 2023-09-28 VITALS — BP 116/75 | HR 70 | Ht 72.0 in | Wt 150.2 lb

## 2023-09-28 DIAGNOSIS — G8929 Other chronic pain: Secondary | ICD-10-CM | POA: Insufficient documentation

## 2023-09-28 DIAGNOSIS — G894 Chronic pain syndrome: Secondary | ICD-10-CM | POA: Insufficient documentation

## 2023-09-28 DIAGNOSIS — Z79891 Long term (current) use of opiate analgesic: Secondary | ICD-10-CM | POA: Insufficient documentation

## 2023-09-28 DIAGNOSIS — Z5181 Encounter for therapeutic drug level monitoring: Secondary | ICD-10-CM | POA: Diagnosis present

## 2023-09-28 DIAGNOSIS — M5441 Lumbago with sciatica, right side: Secondary | ICD-10-CM | POA: Insufficient documentation

## 2023-09-28 DIAGNOSIS — M5442 Lumbago with sciatica, left side: Secondary | ICD-10-CM | POA: Diagnosis present

## 2023-09-28 MED ORDER — HYDROCODONE-ACETAMINOPHEN 5-325 MG PO TABS: 0.5000 | ORAL_TABLET | Freq: Three times a day (TID) | ORAL | 0 refills | Status: AC | PRN

## 2023-09-28 MED ORDER — HYDROCODONE-ACETAMINOPHEN 5-325 MG PO TABS: 0.5000 | ORAL_TABLET | Freq: Three times a day (TID) | ORAL | 0 refills | Status: DC | PRN
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
## 2023-09-28 NOTE — Patient Instructions (Signed)
 Continue current medications; I will prescribe 3 months of medication and have you follow up with Fidela or myself every 3 months  Have a great trip!

## 2023-12-14 ENCOUNTER — Encounter: Payer: Medicare (Managed Care) | Attending: Registered Nurse | Admitting: Physical Medicine and Rehabilitation

## 2023-12-14 ENCOUNTER — Encounter: Payer: Self-pay | Admitting: Physical Medicine and Rehabilitation

## 2023-12-14 VITALS — BP 142/79 | HR 77 | Ht 72.0 in | Wt 148.6 lb

## 2023-12-14 DIAGNOSIS — Z5181 Encounter for therapeutic drug level monitoring: Secondary | ICD-10-CM | POA: Diagnosis present

## 2023-12-14 DIAGNOSIS — G894 Chronic pain syndrome: Secondary | ICD-10-CM | POA: Insufficient documentation

## 2023-12-14 DIAGNOSIS — I10 Essential (primary) hypertension: Secondary | ICD-10-CM | POA: Insufficient documentation

## 2023-12-14 DIAGNOSIS — G8929 Other chronic pain: Secondary | ICD-10-CM | POA: Insufficient documentation

## 2023-12-14 DIAGNOSIS — Z79891 Long term (current) use of opiate analgesic: Secondary | ICD-10-CM | POA: Insufficient documentation

## 2023-12-14 DIAGNOSIS — M5442 Lumbago with sciatica, left side: Secondary | ICD-10-CM | POA: Diagnosis present

## 2023-12-14 DIAGNOSIS — M5441 Lumbago with sciatica, right side: Secondary | ICD-10-CM | POA: Diagnosis present

## 2023-12-14 DIAGNOSIS — M47816 Spondylosis without myelopathy or radiculopathy, lumbar region: Secondary | ICD-10-CM | POA: Diagnosis present

## 2023-12-14 DIAGNOSIS — G5791 Unspecified mononeuropathy of right lower limb: Secondary | ICD-10-CM | POA: Diagnosis present

## 2023-12-14 MED ORDER — HYDROCODONE-ACETAMINOPHEN 5-325 MG PO TABS: 0.5000 | ORAL_TABLET | Freq: Three times a day (TID) | ORAL | 0 refills | Status: DC | PRN

## 2023-12-14 MED ORDER — METHYLPREDNISOLONE 4 MG PO TBPK
ORAL_TABLET | ORAL | 0 refills | Status: AC
Start: 2023-12-14 — End: ?

## 2023-12-14 NOTE — Progress Notes (Signed)
 Subjective:    Patient ID: Kayla Howard, female    DOB: November 14, 1965, 58 y.o.   MRN: [id number]  HPI   Kayla Howard is a 58 y.o. year old female  who  has a past medical history of Anxiety, Chronic back pain, Complication of anesthesia, Depression, History of bronchitis (2005), History of migraine, Insomnia, Pneumonia, and PTSD (post-traumatic stress disorder).      They are presenting to PM&R clinic for follow up related to chronic low back pain s/p fusion L4-S1 with spinal cord stimulator (nonfunctional), and T8-9 thoracic SCI (functional injury).   Plan from last visit:  Chronic pain syndrome Encounter for therapeutic drug monitoring Encounter for long-term use of opiate analgesic Chronic bilateral low back pain with sciatica, sciatica laterality unspecified   Stable on Norco 5 mg TID PRN - uses more in cold weather than recent warm weather   Continue current medications; I will prescribe 3 months of medication and have you follow up with Fidela or myself every 3 months   Have a great trip!     Interval Hx:  Discussed the use of AI scribe software for clinical note transcription with the patient, who gave verbal consent to proceed.  History of Present Illness   Kayla Howard is a 58 year old female with chronic pain who presents for management of pain exacerbated by weather changes.  Her chronic pain worsens with cold, damp weather, and a recent trip in such conditions increased her pain. She uses Norco 5 mg three times daily as needed and occasionally ibuprofen  800 mg, which also alleviates migraines. She experiences no gastrointestinal side effects from ibuprofen .  She reports burning pain in her hip, related to nerve damage from surgery five years ago, which is aggravated by lying on her right side but improves with position changes. She has undergone spine fusion surgery and uses lidocaine  patches and Biofreeze for additional pain relief. Her blood pressure is slightly  elevated today, though she does not regularly monitor it at home.         Pain Inventory Average Pain 5 Pain Right Now 7 My pain is sharp, burning, stabbing, and aching  In the last 24 hours, has pain interfered with the following? General activity 7 Relation with others 3 Enjoyment of life 2 What TIME of day is your pain at its worst? evening Sleep (in general) Fair  Pain is worse with: walking, bending, sitting, and standing Pain improves with: heat/ice and medication Relief from Meds: 8  Family History  Problem Relation Age of Onset   Hypertension Mother    Diabetes Father    Social History   Socioeconomic History   Marital status: Single    Spouse name: Not on file   Number of children: Not on file   Years of education: Not on file   Highest education level: Not on file  Occupational History   Not on file  Tobacco Use   Smoking status: Former    Current packs/day: 0.00    Average packs/day: 1 pack/day for 10.0 years (10.0 ttl pk-yrs)    Types: Cigarettes    Start date: 2005    Quit date: 2015    Years since quitting: 10.8   Smokeless tobacco: Never   Tobacco comments:    quit smoking 5 yrs ago  Vaping Use   Vaping status: Every Day   Substances: Nicotine, Flavoring  Substance and Sexual Activity   Alcohol  use: Yes    Comment: rarely   Drug use: Not  Currently    Types: Marijuana    Comment: uses for pain management - last use 07/21/17   Sexual activity: Yes    Birth control/protection: None  Other Topics Concern   Not on file  Social History Narrative   Not on file   Social Drivers of Health   Financial Resource Strain: Low Risk (10/21/2022)   Received from [facility]   Overall Financial Resource Strain (CARDIA)    Difficulty of Paying Living Expenses: Not hard at all  Food Insecurity: No Food Insecurity (06/02/2023)   Received from [facility]   Hunger Vital Sign    Within the past 12 months, you worried that your food would run out  before you got the money to buy more.: Never true    Within the past 12 months, the food you bought just didn't last and you didn't have money to get more.: Never true  Transportation Needs: No Transportation Needs (06/02/2023)   Received from [facility]    Lack of Transportation (Medical): No    Lack of Transportation (Non-Medical): No  Physical Activity: Not on file  Stress: Not on file  Social Connections: Unknown (06/16/2021)   Received from [facility]   Social Network    Social Network: Not on file   Past Surgical History:  Procedure Laterality Date   BACK SURGERY     x2   battery replacement to spinal cord stimulator      epidural injections     ganglion cyst removed from right wrist     PAIN PUMP REMOVAL N/A 06/25/2016   Procedure: Removal of Implantable Pulse Generator;  Surgeon: Unice Pac, MD;  Location[facility] OR;  Service: Neurosurgery;  Laterality: N/A;   rotator cuff surgery Right    SPINAL CORD STIMULATOR INSERTION     SPINAL CORD STIMULATOR INSERTION N/A 06/25/2016   Procedure: LUMBAR SPINAL CORD STIMULATOR INSERTION AND  REMOVAL OF OLD PADDLE STIMULATOR;  Surgeon: Unice Pac, MD;  Location: [facility] OR;  Service: Neurosurgery;  Laterality: N/A;   TOTAL HIP ARTHROPLASTY Right 08/02/2017   TOTAL HIP ARTHROPLASTY Right 08/02/2017   Procedure: RIGHT TOTAL HIP ARTHROPLASTY ANTERIOR APPROACH;  Surgeon: Beverley Evalene BIRCH, MD;  Location: [facility] OR;  Service: Orthopedics;  Laterality: Right;   TOTAL HIP ARTHROPLASTY Left 08/29/2018   Procedure: TOTAL HIP ARTHROPLASTY ANTERIOR APPROACH;  Surgeon: Beverley Evalene BIRCH, MD;  Location: [facility] ORS;  Service: Orthopedics;  Laterality: Left;   warts removed from knees  70's   wisdom teeth extracted      Past Surgical History:  Procedure Laterality Date   BACK SURGERY     x2   battery replacement to spinal cord stimulator      epidural injections     ganglion cyst removed from right wrist     PAIN PUMP REMOVAL  N/A 06/25/2016   Procedure: Removal of Implantable Pulse Generator;  Surgeon: Unice Pac, MD;  Location: [facility] OR;  Service: Neurosurgery;  Laterality: N/A;   rotator cuff surgery Right    SPINAL CORD STIMULATOR INSERTION     SPINAL CORD STIMULATOR INSERTION N/A 06/25/2016   Procedure: LUMBAR SPINAL CORD STIMULATOR INSERTION AND  REMOVAL OF OLD PADDLE STIMULATOR;  Surgeon: Unice Pac, MD;  Location: [facility] OR;  Service: Neurosurgery;  Laterality: N/A;   TOTAL HIP ARTHROPLASTY Right 08/02/2017   TOTAL HIP ARTHROPLASTY Right 08/02/2017   Procedure: RIGHT TOTAL HIP ARTHROPLASTY ANTERIOR APPROACH;  Surgeon: Beverley Evalene BIRCH, MD;  Location: [facility] OR;  Service: Orthopedics;  Laterality: Right;   TOTAL HIP ARTHROPLASTY Left 08/29/2018   Procedure: TOTAL HIP ARTHROPLASTY ANTERIOR APPROACH;  Surgeon: Beverley Evalene BIRCH, MD;  Location: [facility] ORS;  Service: Orthopedics;  Laterality: Left;   warts removed from knees  70's   wisdom teeth extracted      Past Medical History:  Diagnosis Date   Anxiety    takes Valium  nightly   Chronic back pain    several back surgeries   Complication of anesthesia    gets very anxious after anesthesia   Depression    History of bronchitis 2005   History of migraine    last one 6 months ago   Insomnia    takes Trazodone  nightly   Pneumonia    hx of 2005   PTSD (post-traumatic stress disorder)    takes Lamictal ,Minipress ,and Latuda  nightly   BP (!) 142/79 (BP Location: Left Arm, Patient Position: Sitting, Cuff Size: Normal)   Pulse 77   Ht 6' (1.829 m)   Wt 148 lb 9.6 oz (67.4 kg)   SpO2 95%   BMI 20.15 kg/m   Opioid Risk Score:   Fall Risk Score:  `1  Depression screen [facility] 2/9     09/28/2023    9:48 AM 05/30/2023    9:53 AM 02/21/2023   10:06 AM  Depression screen PHQ 2/9  Decreased Interest 0 0 2  Down, Depressed, Hopeless 0 0 1  PHQ - 2 Score 0 0 3  Altered sleeping 3  1  Tired, decreased energy 0  2  Change in appetite 0  2  Feeling bad or failure  about yourself  0  1  Trouble concentrating 0  2  Moving slowly or fidgety/restless 0  0  Suicidal thoughts 0  0  PHQ-9 Score 3   11      Data saved with a previous flowsheet row definition      Review of Systems  Musculoskeletal:  Positive for back pain.       Widespread back pain       Objective:   Physical Exam    PE: Constitution: Appropriate appearance for age. In mild emotional distress.  Resp: No respiratory distress. No accessory muscle usage. on RA and CTAB Cardio: Well perfused appearance. No peripheral edema. Abdomen: Nondistended. Nontender.   Psych: Appropriate mood and affect Neuro: AAOx4. No apparent cognitive deficits    Neurologic Exam:   Sensory exam: revealed normal sensation in all dermatomal regions in bilateral upper extremities and bilateral lower extremities--hypersensitive to light touch R lateral thigh from anterior ~L1 to lateral ITB  Motor exam: 4/5 strength throughout bilateral lower extremities Coordination: Fine motor coordination was normal.   Gait: Stiff, stable stance and stride    MSK: Reduced cervical lordosis and thoracic kyphosis + ttp throughout bilateral lumbar  paraspinals, no TTP B/L PSIS,  SI joints or GTB.  + facet loading bilaterally - slump - SLR     Assessment & Plan:   Quenesha Douglass is a 58 y.o. year old female  who  has a past medical history of Anxiety, Chronic back pain, Complication of anesthesia, Depression, History of bronchitis (2005), History of migraine, Insomnia, Pneumonia, and PTSD (post-traumatic stress disorder).   They are presenting to PM&R clinic for  chronic low back pain s/p fusion L4-S1 with spinal cord stimulator (nonfunctional), and T8-9 thoracic SCI (functional injury), R hip neuropathic pain.    CT T sppine 03/2023: IMPRESSION: 1. No acute  abnormality of the thoracic spine. 2. Posterior decompression at T11-12. Spinal cord stimulator lead with tip at the mid T9 level. 3. Multilevel thoracic  spondylosis without CT evidence of high-grade osseous spinal canal or foraminal stenosis.     CT L spine 03/2023: IMPRESSION: 1. Posterior instrumented fusion from L4-S1. Hardware is intact. No definite mature osseous fusion of the L5 and S1 vertebral bodies on the current study. 2. Multilevel spondylosis as described. Mild spinal canal stenosis at L2-3 and L3-4. 3. Moderate to severe left foraminal stenosis at L5-S1. 4. Moderate distension of the partially visualized urinary bladder. Correlate for urinary retention.  Assessment and Plan    Chronic pain syndrome with neuropathic pain of right hip and groin Chronic pain likely due to nerve damage from previous hip surgery. Pain described as burning, exacerbated by lying on the right side. Steroids may help with inflammation and nerve sensitivity. - Prescribed Medrol  Dosepak to reduce inflammation  - Recommended over-the-counter lidocaine  cream or patches for localized burning pain. - Continue Norco 5 mg 1-2 tabs TID; refilled for 3 months - Follow up in 3 months  R lateral thigh neuropathy s/p THR Soreness upon pressure, no acute exacerbation. Uses Biofreeze for relief. - Continue current management with Biofreeze and Lidocaine  patches for symptomatic relief.  Essential hypertension Blood pressure slightly elevated at 142/79. Previous medication discontinued due to cough. - Advised home blood pressure monitoring, especially if a cuff is available. - Instructed to report consistently elevated readings over 120 to primary care physician.

## 2023-12-14 NOTE — Patient Instructions (Signed)
 Steroiud taper for acute pain Lidocaine  patches for right hip Continue current norco script Follow up in 3 months

## 2023-12-21 LAB — DRUG TOX MONITOR 1 W/CONF, ORAL FLD
AMINOCLONAZEPAM: NEGATIVE ng/mL (ref <0.50)
Alprazolam: NEGATIVE ng/mL (ref <0.50)
Amobarbital: NEGATIVE ng/mL (ref <10)
Amphetamines: NEGATIVE ng/mL (ref <10)
Barbiturates: NEGATIVE ng/mL (ref <10)
Benzodiazepines: NEGATIVE ng/mL (ref <0.50)
Buprenorphine: NEGATIVE ng/mL (ref <0.10)
Clonazepam: NEGATIVE ng/mL (ref <0.50)
Cocaine: NEGATIVE ng/mL (ref <5.0)
Codeine: NEGATIVE ng/mL (ref <2.5)
Cotinine: 58.7 ng/mL — ABNORMAL HIGH (ref <5.0)
Diazepam: NEGATIVE ng/mL (ref <0.50)
Dihydrocodeine: 2.8 ng/mL — ABNORMAL HIGH (ref <2.5)
Flunitrazepam: NEGATIVE ng/mL (ref <0.50)
Flurazepam: NEGATIVE ng/mL (ref <0.50)
Heroin Metabolite: NEGATIVE ng/mL (ref <1.0)
Hydrocodone: >250 ng/mL — ABNORMAL HIGH (ref <2.5)
Hydromorphone: NEGATIVE ng/mL (ref <2.5)
Lorazepam: NEGATIVE ng/mL (ref <0.50)
MARIJUANA: POSITIVE ng/mL — AB (ref <2.5)
MDMA: NEGATIVE ng/mL (ref <10)
Meprobamate: NEGATIVE ng/mL (ref <2.5)
Methadone: NEGATIVE ng/mL (ref <5.0)
Morphine: NEGATIVE ng/mL (ref <2.5)
Naloxone: NEGATIVE ng/mL (ref <0.25)
Nicotine Metabolite: POSITIVE ng/mL — AB (ref <5.0)
Norbuprenorphine: NEGATIVE ng/mL (ref <0.50)
Nordiazepam: NEGATIVE ng/mL (ref <0.50)
Norhydrocodone: 4.1 ng/mL — ABNORMAL HIGH (ref <2.5)
Noroxycodone: NEGATIVE ng/mL (ref <2.5)
Opiates: POSITIVE ng/mL — AB (ref <2.5)
Oxazepam: NEGATIVE ng/mL (ref <0.50)
Oxycodone: NEGATIVE ng/mL (ref <2.5)
Oxymorphone: NEGATIVE ng/mL (ref <2.5)
Pentobarbital: NEGATIVE ng/mL (ref <10)
Phencyclidine: NEGATIVE ng/mL (ref <10)
Phenobarbital: NEGATIVE ng/mL (ref <10)
Secobarbital: NEGATIVE ng/mL (ref <10)
THC: >250 ng/mL — ABNORMAL HIGH (ref <2.5)
Tapentadol: NEGATIVE ng/mL (ref <5.0)
Temazepam: NEGATIVE ng/mL (ref <0.50)
Tramadol: NEGATIVE ng/mL (ref <5.0)
Triazolam: NEGATIVE ng/mL (ref <0.50)
Zolpidem: NEGATIVE ng/mL (ref <5.0)

## 2023-12-21 LAB — DRUG TOX ALC METAB W/CON, ORAL FLD
Alcohol Metabolite: POSITIVE ng/mL — AB (ref <25)
Ethyl Sulfate: 356 ng/mL — ABNORMAL HIGH (ref <25)

## 2024-01-01 IMAGING — RF DG FLUORO GUIDE NDL PLC/BX
4 series · 4 of 4 positions shown · non-contrast
Comparison: none

CLINICAL DATA: Left knee pain. Request for left knee arthrogram and
CT.

[Series 1: cp_standard · 0.18mm/px · 1 of 1 slices shown (1 of 4)]
[im 1/1]
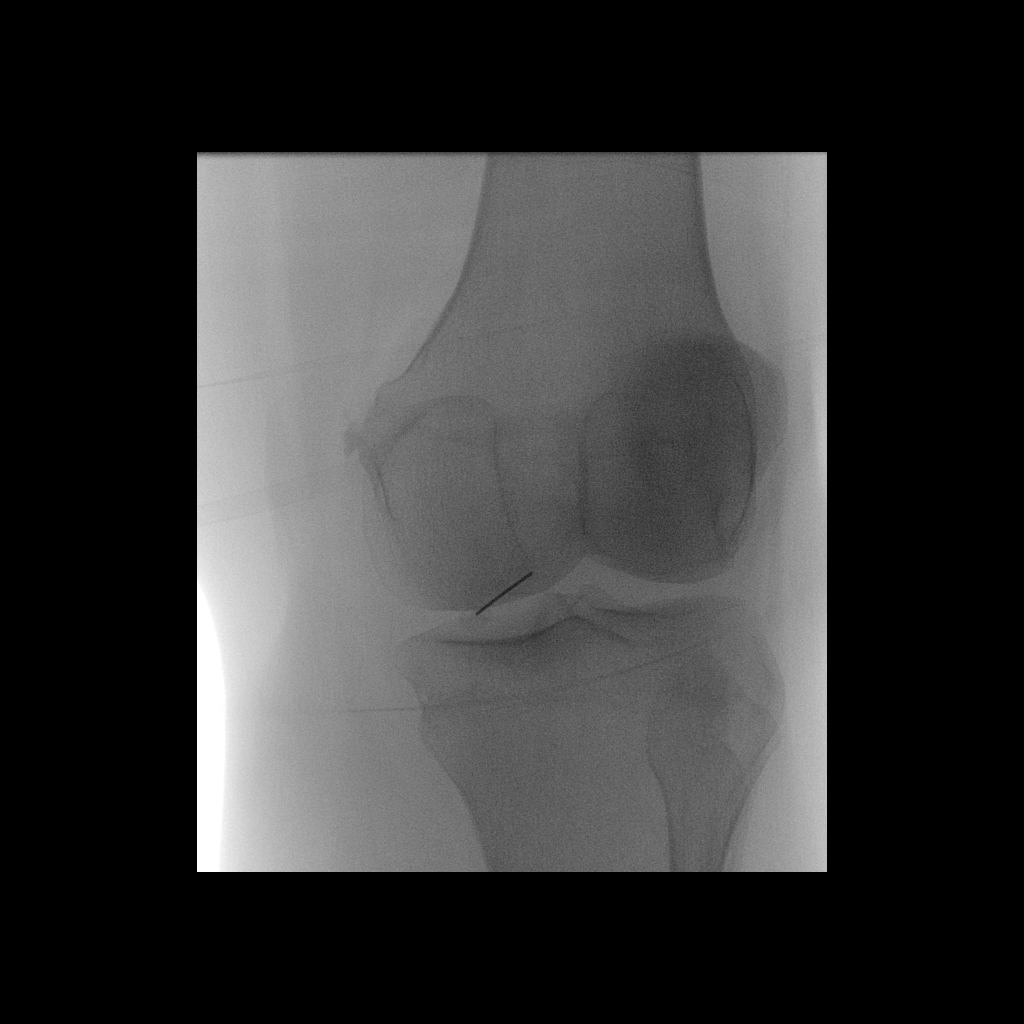

[Series 2: cp_standard · 0.18mm/px · 1 of 1 slices shown (2 of 4)]
[im 1/1]
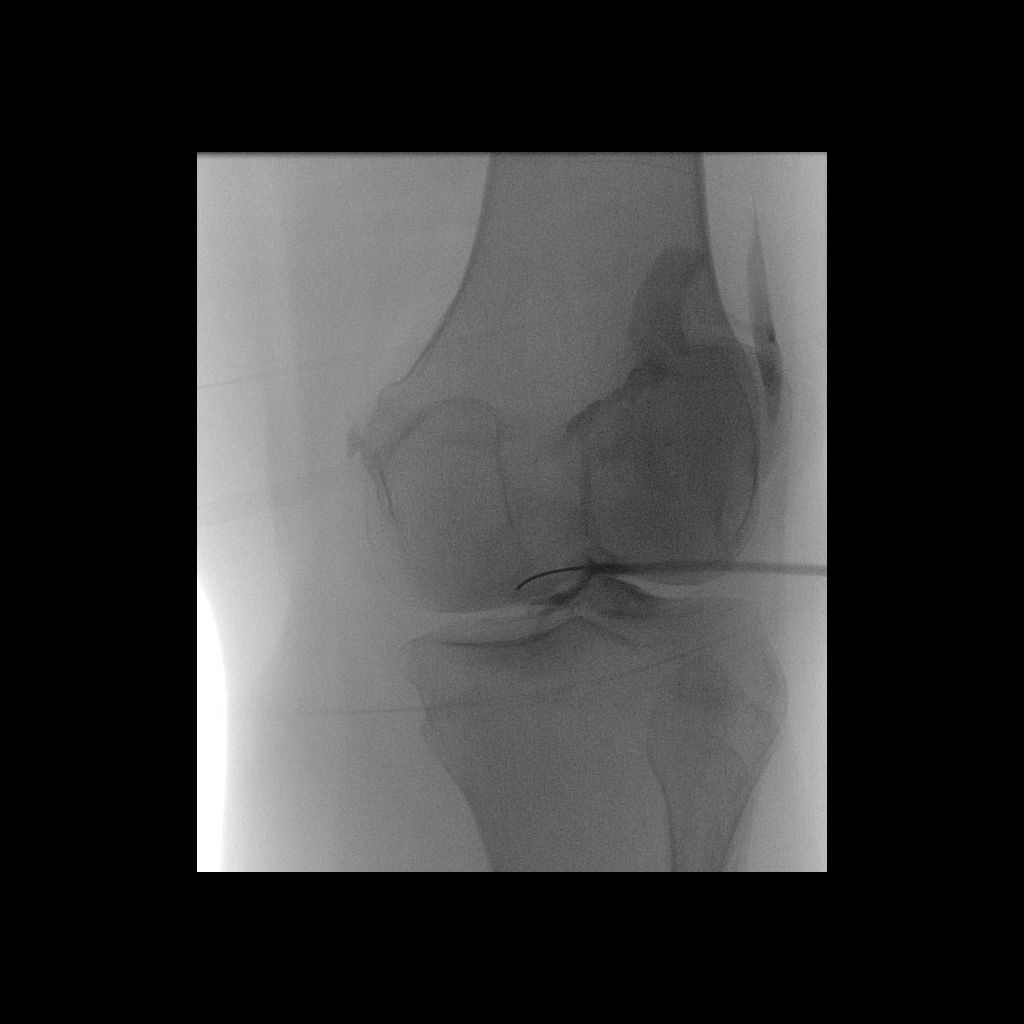

[Series 3: cp_standard · 0.18mm/px · 1 of 1 slices shown (3 of 4)]
[im 1/1]
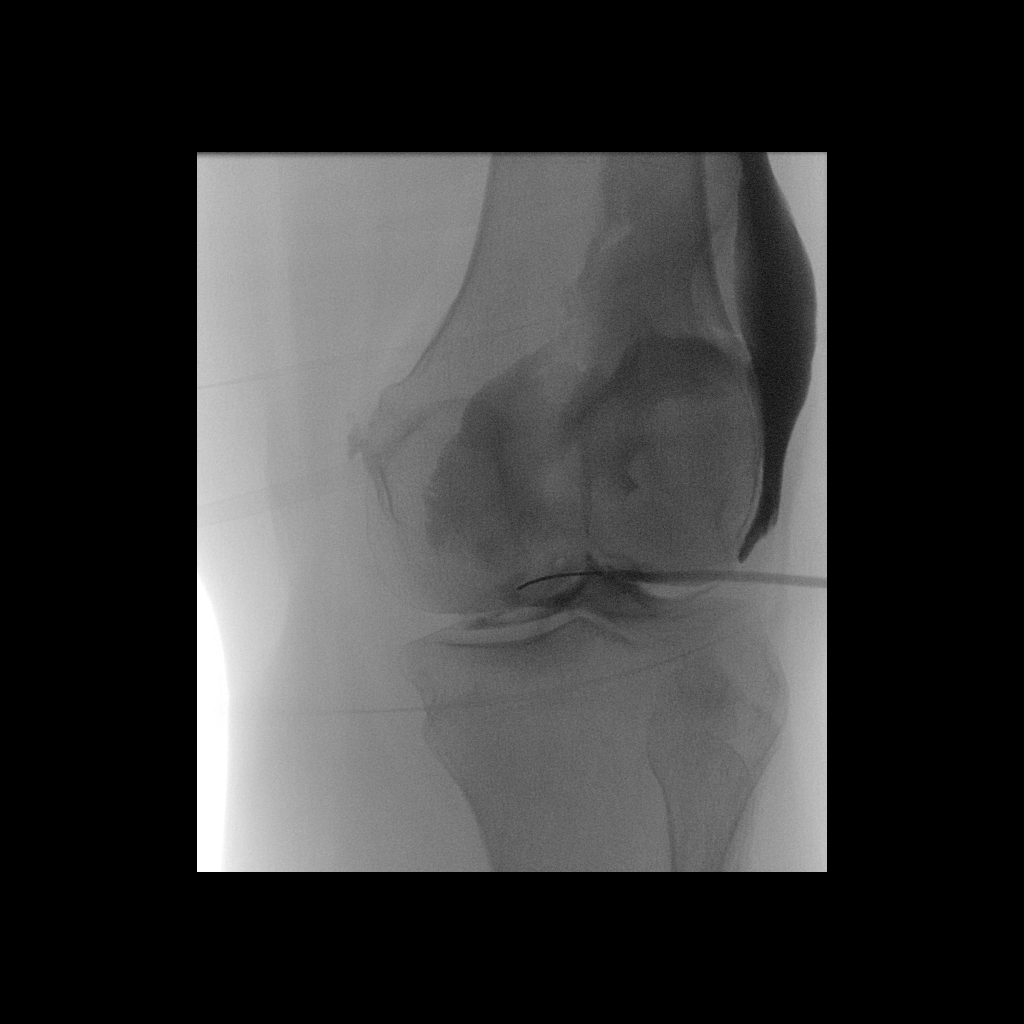

[Series 4: cp_standard · 0.18mm/px · 1 of 1 slices shown (4 of 4)]
[im 1/1]
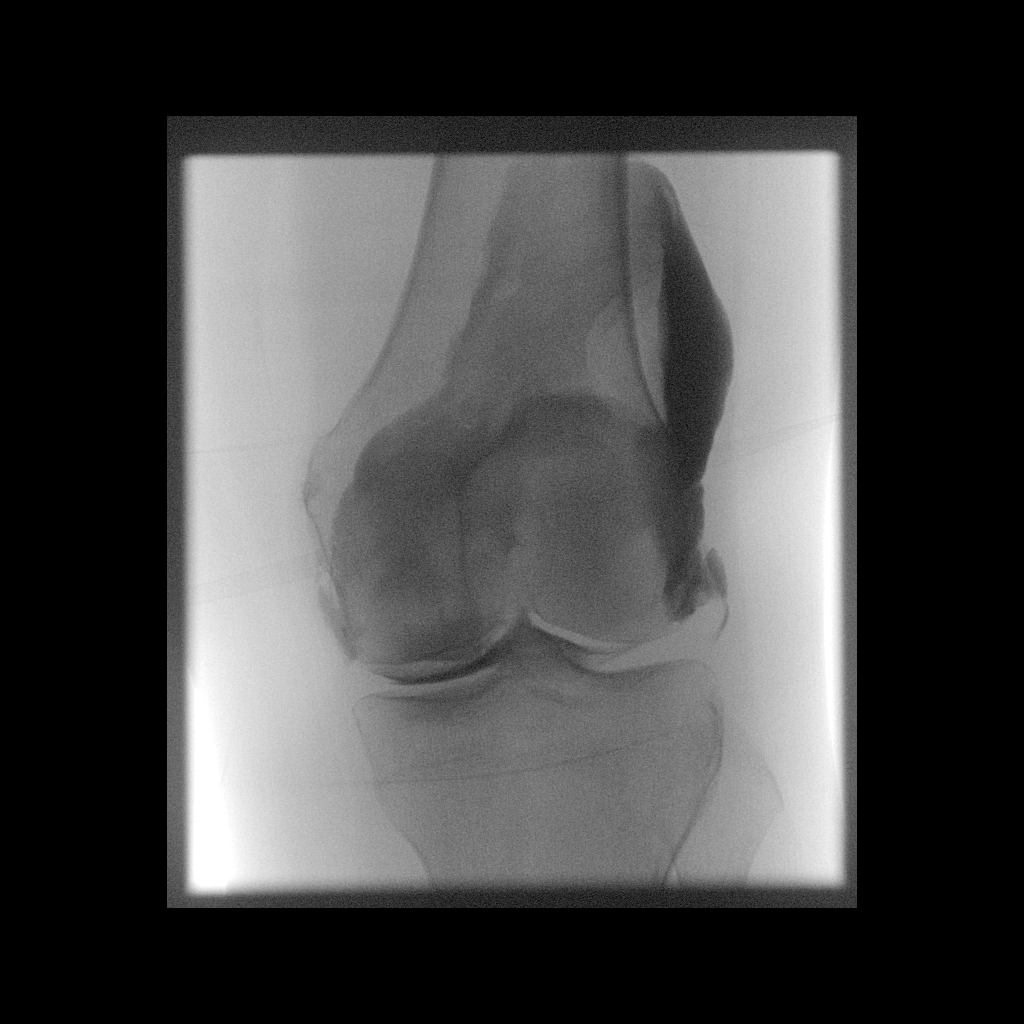

[4 of 4 positions shown; findings below may reference images not displayed]

EXAM:
CT ARTHROGRAM INJECTION OF THE LEFT KNEE. Procedure performed by

FLUOROSCOPY:
Time: 48 seconds

Dose: 11.34 uGy*m2

PROCEDURE:
After a thorough discussion of risks and benefits of the procedure,
written and oral informed consent was obtained. General risks
included bleeding, infection, injury to nerves, blood vessels, and
adjacent structures. Specific risks included extra-articular
injection. Informed consent obtained. Localization was performed
over the medial inferior aspect of the medial femoral condyle.
Sterile prep and drape was performed. Skin was anesthetized with 1%
lidocaine without epinephrine.

Solution was made as follows: 30 ml of 8mni-38L contrast agent, 10
mL sterile saline.

2-1/2 inch 22 gauge spinal needle was advanced into the knee joint
under direct fluoroscopic guidance. Total volume of approximately 35
ml of contrast were injected at which point back pressure was felt
and the injection was discontinued. Adequate filling of the joint
was achieved fluoroscopically. Needle was removed and a sterile
dressing was applied. Bandage was applied to the suprapatellar pouch
for imaging with instructions to remove after CT.

There were no complications and the patient tolerated the procedure
well.
IMPRESSION: Successful left knee arthrogram injection as above.

## 2024-01-02 ENCOUNTER — Ambulatory Visit: Payer: Self-pay | Admitting: Physical Medicine and Rehabilitation

## 2024-01-02 NOTE — Progress Notes (Signed)
 UDS + Marijuana and alcohol  - patient already had one warning about alcohol  - please inform we will pursue non-narcotic management only moving forward and she will need to wean off medication. Thank you!

## 2024-01-03 MED ORDER — HYDROCODONE-ACETAMINOPHEN 5-325 MG PO TABS: ORAL_TABLET | ORAL | 0 refills | Status: AC

## 2024-01-03 NOTE — Telephone Encounter (Signed)
 Call placed to [facility], regarding her oral swab results. No answer, left message to return the call.  [facility] pharmacy was called, The three  Hydrocodone  prescriptions that were hold was removed from her profile.  New Hydrocodone  prescription sent to the pharmacy.  Per Dr Emeline note, she will be non- narcotic.

## 2024-03-14 ENCOUNTER — Encounter: Payer: Medicare (Managed Care) | Admitting: Physical Medicine and Rehabilitation
